# Patient Record
Sex: Female | Born: 1980 | Race: Black or African American | Hispanic: No | Marital: Married | Smoking: Never smoker
Health system: Southern US, Community
[De-identification: ages and names within clinical notes are randomized; demographics above are authoritative.]

## PROBLEM LIST (undated history)

## (undated) DIAGNOSIS — N979 Female infertility, unspecified: Secondary | ICD-10-CM

## (undated) DIAGNOSIS — K219 Gastro-esophageal reflux disease without esophagitis: Secondary | ICD-10-CM

## (undated) DIAGNOSIS — G4733 Obstructive sleep apnea (adult) (pediatric): Secondary | ICD-10-CM

## (undated) DIAGNOSIS — J45909 Unspecified asthma, uncomplicated: Secondary | ICD-10-CM

## (undated) DIAGNOSIS — Z789 Other specified health status: Secondary | ICD-10-CM

## (undated) DIAGNOSIS — R0602 Shortness of breath: Secondary | ICD-10-CM

## (undated) DIAGNOSIS — R12 Heartburn: Secondary | ICD-10-CM

## (undated) DIAGNOSIS — D649 Anemia, unspecified: Secondary | ICD-10-CM

## (undated) DIAGNOSIS — U099 Post covid-19 condition, unspecified: Secondary | ICD-10-CM

## (undated) HISTORY — DX: Gastro-esophageal reflux disease without esophagitis: K21.9

## (undated) HISTORY — DX: Obstructive sleep apnea (adult) (pediatric): G47.33

## (undated) HISTORY — DX: Post covid-19 condition, unspecified: U09.9

## (undated) HISTORY — DX: Shortness of breath: R06.02

## (undated) HISTORY — DX: Unspecified asthma, uncomplicated: J45.909

## (undated) HISTORY — PX: DIAGNOSTIC LAPAROSCOPY: SUR761

## (undated) HISTORY — DX: Heartburn: R12

## (undated) HISTORY — DX: Female infertility, unspecified: N97.9

## (undated) HISTORY — PX: DILATION AND CURETTAGE OF UTERUS: SHX78

## (undated) HISTORY — DX: Anemia, unspecified: D64.9

---

## 2010-12-23 ENCOUNTER — Other Ambulatory Visit (HOSPITAL_COMMUNITY): Payer: Self-pay | Admitting: Obstetrics and Gynecology

## 2010-12-23 DIAGNOSIS — N939 Abnormal uterine and vaginal bleeding, unspecified: Secondary | ICD-10-CM

## 2010-12-30 ENCOUNTER — Ambulatory Visit (HOSPITAL_COMMUNITY)
Admission: RE | Admit: 2010-12-30 | Discharge: 2010-12-30 | Disposition: A | Discharge status: Home / Self Care | Payer: 59 | Source: Ambulatory Visit | Attending: Obstetrics and Gynecology | Admitting: Obstetrics and Gynecology

## 2010-12-30 ENCOUNTER — Ambulatory Visit (HOSPITAL_COMMUNITY): Payer: 59

## 2010-12-30 DIAGNOSIS — N949 Unspecified condition associated with female genital organs and menstrual cycle: Secondary | ICD-10-CM | POA: Insufficient documentation

## 2010-12-30 DIAGNOSIS — N939 Abnormal uterine and vaginal bleeding, unspecified: Secondary | ICD-10-CM

## 2010-12-30 DIAGNOSIS — N96 Recurrent pregnancy loss: Secondary | ICD-10-CM | POA: Insufficient documentation

## 2012-10-12 NOTE — L&D Delivery Note (Signed)
Delivery Note At 2:12 PM a viable and healthy female was delivered via Vaginal, Spontaneous Delivery (Presentation: ; Occiput Anterior).  APGAR: 6, 7; weight pending. Baby to NICU. NICU in attendance. Placenta status: Intact, Spontaneous.  Cord: 3 vessels with the following complications: None.  Cord pH: 7.15  Anesthesia: Epidural  Episiotomy: None Lacerations: None Suture Repair: na Est. Blood Loss (mL): 300  Mom to postpartum.  Baby to NICU.  Latonya Knight J 06/23/2013, 5:29 PM

## 2013-03-03 LAB — OB RESULTS CONSOLE HEPATITIS B SURFACE ANTIGEN: Hepatitis B Surface Ag: NEGATIVE

## 2013-03-03 LAB — OB RESULTS CONSOLE RUBELLA ANTIBODY, IGM: Rubella: IMMUNE

## 2013-03-03 LAB — OB RESULTS CONSOLE ANTIBODY SCREEN: Antibody Screen: NEGATIVE

## 2013-03-03 LAB — OB RESULTS CONSOLE ABO/RH

## 2013-03-20 ENCOUNTER — Other Ambulatory Visit: Payer: Self-pay | Admitting: Obstetrics and Gynecology

## 2013-03-20 ENCOUNTER — Encounter (HOSPITAL_COMMUNITY): Payer: Self-pay | Admitting: *Deleted

## 2013-03-20 NOTE — H&P (Signed)
Tara Farrell, Tara Farrell              ACCOUNT NO.:  [id number]  MEDICAL RECORD NO.:  [id number]  LOCATION:  [facility]                         FACILITY:  [facility]  PHYSICIAN:  Lenoard Aden, M.D.DATE OF BIRTH:  12/23/1980  DATE OF ADMISSION:  03/21/2013 DATE OF DISCHARGE:                             HISTORY & PHYSICAL   CHIEF COMPLAINT:  History of second trimester pregnancy loss for cervical insufficiency, for McDonald cervical cerclage.  HISTORY OF PRESENT ILLNESS:  She is a 32 year old, African American female, G3, P1 with history of ectopic x1 and 18 week pregnancy loss who presents now for elective cerclage at 13 weeks and 1/7 day gestation.  ALLERGIES:  She has no known drug allergies.  MEDICATIONS:  Prenatal vitamins.  SOCIAL HISTORY:  She is a nonsmoker, nondrinker.  Denies domestic physical violence.  FAMILY HISTORY:  Heart disease and hypertension.  PREGNANCY HISTORY:  As previously noted.  PHYSICAL EXAMINATION:  GENERAL:  She is an obese African American female, height 62 inches, weight of 234 pounds. HEENT:  Normal. NECK:  Supple.  Full range of motion. LUNGS:  Clear. HEART:  Regular rate and rhythm. ABDOMEN:  Soft, gravid, and nontender.  Fetal heart tones auscultated. Cervix closed, long, out of the pelvis. EXTREMITIES:  No cords. NEUROLOGIC:  Nonfocal. SKIN:  Intact.  IMPRESSION:  A 13-week intrauterine pregnancy with a history of second trimester pregnancy loss with subjective diagnosis of cervical insufficiency.  PLAN:  Proceed with McDonald cervical cerclage.  Risks of anesthesia, infection, injury to surrounding organs, and need for repair was discussed, delayed versus immediate complications related to such injury as noted.  Small risk of less than 1% pregnancy loss discussed, inability to prevent second trimester or early pregnancy loss discussed. The patient acknowledges and wishes to proceed.     Lenoard Aden, M.D.     RJT/MEDQ  D:   03/20/2013  T:  03/20/2013  Job:  [id number]

## 2013-03-21 ENCOUNTER — Ambulatory Visit (HOSPITAL_COMMUNITY): Payer: Managed Care, Other (non HMO) | Admitting: Anesthesiology

## 2013-03-21 ENCOUNTER — Encounter (HOSPITAL_COMMUNITY): Payer: Self-pay | Admitting: Anesthesiology

## 2013-03-21 ENCOUNTER — Ambulatory Visit (HOSPITAL_COMMUNITY)
Admission: RE | Admit: 2013-03-21 | Discharge: 2013-03-21 | Disposition: A | Discharge status: Home / Self Care | Payer: Managed Care, Other (non HMO) | Source: Ambulatory Visit | Attending: Obstetrics and Gynecology | Admitting: Obstetrics and Gynecology

## 2013-03-21 ENCOUNTER — Encounter (HOSPITAL_COMMUNITY)
Admission: RE | Disposition: A | Discharge status: Home / Self Care | Payer: Self-pay | Source: Ambulatory Visit | Attending: Obstetrics and Gynecology

## 2013-03-21 ENCOUNTER — Encounter (HOSPITAL_COMMUNITY): Payer: Self-pay | Admitting: Pharmacy Technician

## 2013-03-21 ENCOUNTER — Encounter (HOSPITAL_COMMUNITY): Payer: Self-pay | Admitting: *Deleted

## 2013-03-21 DIAGNOSIS — O3431 Maternal care for cervical incompetence, first trimester: Secondary | ICD-10-CM

## 2013-03-21 DIAGNOSIS — O3430 Maternal care for cervical incompetence, unspecified trimester: Secondary | ICD-10-CM | POA: Insufficient documentation

## 2013-03-21 HISTORY — PX: CERVICAL CERCLAGE: SHX1329

## 2013-03-21 HISTORY — DX: Other specified health status: Z78.9

## 2013-03-21 LAB — BASIC METABOLIC PANEL
CO2: 20 mEq/L (ref 19–32)
Calcium: 9.4 mg/dL (ref 8.4–10.5)
GFR calc non Af Amer: >90 mL/min (ref >90)
Potassium: 3.5 mEq/L (ref 3.5–5.1)
Sodium: 133 mEq/L — ABNORMAL LOW (ref 135–145)

## 2013-03-21 LAB — CBC
Hemoglobin: 11.2 g/dL — ABNORMAL LOW (ref 12.0–15.0)
Platelets: 250 10*3/uL (ref 150–400)
RBC: 5.13 MIL/uL — ABNORMAL HIGH (ref 3.87–5.11)
WBC: 5.5 10*3/uL (ref 4.0–10.5)

## 2013-03-21 SURGERY — CERCLAGE, CERVIX, VAGINAL APPROACH
Anesthesia: Spinal | Site: Cervix | Wound class: Clean Contaminated

## 2013-03-21 MED ORDER — LACTATED RINGERS IV SOLN
INTRAVENOUS | Status: DC
  Administered 2013-03-21: 13:00:00 via INTRAVENOUS

## 2013-03-21 MED ORDER — PHENYLEPHRINE HCL 10 MG/ML IJ SOLN
INTRAMUSCULAR | Status: DC | PRN
  Administered 2013-03-21: 80 ug via INTRAVENOUS

## 2013-03-21 MED ORDER — BUPIVACAINE IN DEXTROSE 0.75-8.25 % IT SOLN
INTRATHECAL | Status: DC | PRN
  Administered 2013-03-21: 1.2 mL via INTRATHECAL

## 2013-03-21 MED ORDER — MIDAZOLAM HCL 2 MG/2ML IJ SOLN: 0.5000 mg | Freq: Once | INTRAMUSCULAR | Status: DC | PRN

## 2013-03-21 MED ORDER — CEFAZOLIN SODIUM-DEXTROSE 2-3 GM-% IV SOLR
INTRAVENOUS | Status: AC
  Filled 2013-03-21: qty 50

## 2013-03-21 MED ORDER — FENTANYL CITRATE 0.05 MG/ML IJ SOLN: 25.0000 ug | INTRAMUSCULAR | Status: DC | PRN

## 2013-03-21 MED ORDER — CEFAZOLIN SODIUM-DEXTROSE 2-3 GM-% IV SOLR
2.0000 g | INTRAVENOUS | Status: AC
  Administered 2013-03-21: 2 g via INTRAVENOUS

## 2013-03-21 MED ORDER — PROMETHAZINE HCL 25 MG/ML IJ SOLN: 6.2500 mg | INTRAMUSCULAR | Status: DC | PRN

## 2013-03-21 MED ORDER — MEPERIDINE HCL 25 MG/ML IJ SOLN: 6.2500 mg | INTRAMUSCULAR | Status: DC | PRN

## 2013-03-21 MED ORDER — PANTOPRAZOLE SODIUM 40 MG IV SOLR
40.0000 mg | Freq: Once | INTRAVENOUS | Status: AC
  Administered 2013-03-21: 40 mg via INTRAVENOUS
  Filled 2013-03-21: qty 40

## 2013-03-21 SURGICAL SUPPLY — 25 items
CATH ROBINSON RED A/P 16FR (CATHETERS) ×2 IMPLANT
CLOTH BEACON ORANGE TIMEOUT ST (SAFETY) ×2 IMPLANT
COUNTER NEEDLE 1200 MAGNETIC (NEEDLE) ×2 IMPLANT
GLOVE BIO SURGEON STRL SZ7.5 (GLOVE) ×4 IMPLANT
GLOVE BIOGEL PI IND STRL 7.0 (GLOVE) ×2 IMPLANT
GLOVE BIOGEL PI IND STRL 7.5 (GLOVE) ×2 IMPLANT
GLOVE BIOGEL PI INDICATOR 7.0 (GLOVE) ×2
GLOVE BIOGEL PI INDICATOR 7.5 (GLOVE) ×2
GLOVE ECLIPSE 6.5 STRL STRAW (GLOVE) ×4 IMPLANT
GLOVE SURG SS PI 6.5 STRL IVOR (GLOVE) ×2 IMPLANT
GLOVE SURG SS PI 7.0 STRL IVOR (GLOVE) ×2 IMPLANT
GLOVE SURG SS PI 7.5 STRL IVOR (GLOVE) ×2 IMPLANT
GOWN PREVENTION PLUS XLARGE (GOWN DISPOSABLE) ×2 IMPLANT
GOWN STRL REIN XL XLG (GOWN DISPOSABLE) ×8 IMPLANT
NEEDLE MAYO .5 CIRCLE (NEEDLE) ×2 IMPLANT
NS IRRIG 1000ML POUR BTL (IV SOLUTION) ×2 IMPLANT
PACK VAGINAL MINOR WOMEN LF (CUSTOM PROCEDURE TRAY) ×2 IMPLANT
PAD OB MATERNITY 4.3X12.25 (PERSONAL CARE ITEMS) ×2 IMPLANT
PAD PREP 24X48 CUFFED NSTRL (MISCELLANEOUS) ×6 IMPLANT
SUT ETHIBOND  5 (SUTURE) ×1
SUT ETHIBOND 5 (SUTURE) ×1 IMPLANT
SUT PROLENE 0 CT 1 30 (SUTURE) ×2 IMPLANT
TOWEL OR 17X24 6PK STRL BLUE (TOWEL DISPOSABLE) ×4 IMPLANT
WATER STERILE IRR 1000ML POUR (IV SOLUTION) ×2 IMPLANT
YANKAUER SUCT BULB TIP NO VENT (SUCTIONS) ×2 IMPLANT

## 2013-03-21 NOTE — Anesthesia Preprocedure Evaluation (Signed)
Anesthesia Evaluation  Patient identified by MRN, date of birth, ID band Patient awake    Reviewed: Allergy & Precautions, H&P , NPO status , Patient's Chart, lab work & pertinent test results  Airway Mallampati: III      Dental no notable dental hx.    Pulmonary neg pulmonary ROS,  breath sounds clear to auscultation  Pulmonary exam normal       Cardiovascular Exercise Tolerance: Good negative cardio ROS  Rhythm:regular Rate:Normal     Neuro/Psych negative neurological ROS  negative psych ROS   GI/Hepatic negative GI ROS, Neg liver ROS,   Endo/Other  negative endocrine ROSMorbid obesity  Renal/GU negative Renal ROS  negative genitourinary   Musculoskeletal   Abdominal Normal abdominal exam  (+)   Peds  Hematology negative hematology ROS (+)   Anesthesia Other Findings   Reproductive/Obstetrics (+) Pregnancy                           Anesthesia Physical Anesthesia Plan  ASA: III  Anesthesia Plan: Spinal   Post-op Pain Management:    Induction:   Airway Management Planned:   Additional Equipment:   Intra-op Plan:   Post-operative Plan:   Informed Consent: I have reviewed the patients History and Physical, chart, labs and discussed the procedure including the risks, benefits and alternatives for the proposed anesthesia with the patient or authorized representative who has indicated his/her understanding and acceptance.     Plan Discussed with: Anesthesiologist, CRNA and Surgeon  Anesthesia Plan Comments:         Anesthesia Quick Evaluation

## 2013-03-21 NOTE — Anesthesia Procedure Notes (Signed)
Spinal  Patient location during procedure: OR Start time: 03/21/2013 1:06 PM Staffing Anesthesiologist: Angus Seller., Harrell Gave. Performed by: anesthesiologist  Preanesthetic Checklist Completed: patient identified, site marked, surgical consent, pre-op evaluation, timeout performed, IV checked, risks and benefits discussed and monitors and equipment checked Spinal Block Patient position: sitting Prep: DuraPrep Patient monitoring: heart rate, cardiac monitor, continuous pulse ox and blood pressure Approach: midline Location: L3-4 Injection technique: single-shot Needle Needle type: Sprotte  Needle gauge: 24 G Needle length: 9 cm Assessment Sensory level: T4 Additional Notes Patient identified.  Risk benefits discussed including failed block, incomplete pain control, headache, nerve damage, paralysis, blood pressure changes, nausea, vomiting, reactions to medication both toxic or allergic, and postpartum back pain.  Patient expressed understanding and wished to proceed.  All questions were answered.  Sterile technique used throughout procedure.  CSF was clear.  No parasthesia or other complications.  Please see nursing notes for vital signs.

## 2013-03-21 NOTE — Op Note (Signed)
03/21/2013  1:26 PM  PATIENT:  Tara Farrell  32 y.o. female  PRE-OPERATIVE DIAGNOSIS:  Cervical Incompetence  - based on history 13 week IUIP  POST-OPERATIVE DIAGNOSIS:  Cervical Incompetence    PROCEDURE:  Procedure(s): CERCLAGE CERVICAL (McDonald) SURGEON:  Surgeon(s): Lenoard Aden, MD  ASSISTANTS: none   ANESTHESIA:   spinal  ESTIMATED BLOOD LOSS: minimal  DRAINS: none   LOCAL MEDICATIONS USED:  NONE  SPECIMEN:  No Specimen  DISPOSITION OF SPECIMEN:  N/A  COUNTS:  YES  DICTATION #: [id number]  PLAN OF CARE: DC home  PATIENT DISPOSITION:  PACU - hemodynamically stable.

## 2013-03-21 NOTE — Anesthesia Postprocedure Evaluation (Signed)
  Anesthesia Post Note  Patient: Tara Farrell  Procedure(s) Performed: Procedure(s) (LRB): CERCLAGE CERVICAL (N/A)  Anesthesia type: Spinal  Patient location: PACU  Post pain: Pain level controlled  Post assessment: Post-op Vital signs reviewed  Last Vitals:  Filed Vitals:   03/21/13 1330  BP: 89/49  Pulse: 90  Temp: 36.3 C  Resp: 17    Post vital signs: Reviewed  Level of consciousness: awake  Complications: No apparent anesthesia complications

## 2013-03-21 NOTE — OR Nursing (Signed)
Pt. Discharge time was 1715 not 1915.

## 2013-03-21 NOTE — Progress Notes (Signed)
Patient ID: Tara Farrell, female   DOB: 1981-03-16, 32 y.o.   MRN: [id number] Patient seen and examined. Fetal heart tones noted. Consent witnessed and signed. No changes noted. Update completed.

## 2013-03-21 NOTE — Transfer of Care (Signed)
Immediate Anesthesia Transfer of Care Note  Patient: Tara Farrell  Procedure(s) Performed: Procedure(s) with comments: CERCLAGE CERVICAL (N/A) - EDD: 09/25/13  Patient Location: PACU  Anesthesia Type:Spinal  Level of Consciousness: awake, oriented and patient cooperative  Airway & Oxygen Therapy: Patient Spontanous Breathing  Post-op Assessment: Report given to PACU RN and Post -op Vital signs reviewed and stable  Post vital signs: Reviewed and stable  Complications: No apparent anesthesia complications

## 2013-03-22 ENCOUNTER — Encounter (HOSPITAL_COMMUNITY): Payer: Self-pay | Admitting: Obstetrics and Gynecology

## 2013-03-22 NOTE — Op Note (Signed)
Tara Farrell, Tara Farrell              ACCOUNT NO.:  [id number]  MEDICAL RECORD NO.:  [id number]  LOCATION:  [facility]                          FACILITY:  [facility]  PHYSICIAN:  Lenoard Aden, M.D.DATE OF BIRTH:  [birth year]  DATE OF PROCEDURE:  03/21/2013 DATE OF DISCHARGE:  03/21/2013                              OPERATIVE REPORT   DESCRIPTION OF PROCEDURE:  After being apprised of risks of anesthesia, infection, bleeding, injury to surrounding organs, possible need for repair, possible risks of pregnancy loss.  Fetal heart tones are heard in recovery in the [facility], preop area at 168 beats per minute. The patient was then brought to the operating room, where she was administered spinal anesthetic without complications.  She was prepped and draped in the sterile fashion.  Catheterized until the bladder was empty.  Exam under anesthesia reveals a patulous, but otherwise normal length cervix and uterus 5-13 week size.  No adnexal masses were appreciated.  At this time, weighted speculum was placed and a 5 Ethibond sutures placed moving from 1 o'clock to 11 o'clock, 10 o'clock to 8 o'clock, 7 o'clock to 5 o'clock, 5 o'clock to 4 o'clock , 2 o'clock to back to 1 o'clock.  This was then tied down over a Prolene tie.  Both were tied and cut along.  The cervix reveals good coaptation.  There is no pulling through the stitch noted.  Good hemostasis was noted.  Fetal heart tone is pending and in recovery room.  The patient tolerated the procedure well and was transferred to recovery in good condition.     Lenoard Aden, M.D.     RJT/MEDQ  D:  03/21/2013  T:  03/22/2013  Job:  [id number]

## 2013-03-24 ENCOUNTER — Encounter (HOSPITAL_COMMUNITY): Payer: Self-pay | Admitting: *Deleted

## 2013-06-04 ENCOUNTER — Encounter (HOSPITAL_COMMUNITY): Payer: Self-pay | Admitting: *Deleted

## 2013-06-04 ENCOUNTER — Inpatient Hospital Stay (HOSPITAL_COMMUNITY)
Admission: AD | Admit: 2013-06-04 | Discharge: 2013-06-25 | DRG: 775 | Disposition: A | Discharge status: Home / Self Care | Payer: Managed Care, Other (non HMO) | Source: Ambulatory Visit | Attending: Obstetrics and Gynecology | Admitting: Obstetrics and Gynecology

## 2013-06-04 ENCOUNTER — Inpatient Hospital Stay (HOSPITAL_COMMUNITY): Payer: Managed Care, Other (non HMO)

## 2013-06-04 DIAGNOSIS — O26879 Cervical shortening, unspecified trimester: Secondary | ICD-10-CM | POA: Diagnosis present

## 2013-06-04 DIAGNOSIS — O4700 False labor before 37 completed weeks of gestation, unspecified trimester: Secondary | ICD-10-CM | POA: Diagnosis present

## 2013-06-04 DIAGNOSIS — O365990 Maternal care for other known or suspected poor fetal growth, unspecified trimester, not applicable or unspecified: Secondary | ICD-10-CM | POA: Diagnosis present

## 2013-06-04 DIAGNOSIS — O3430 Maternal care for cervical incompetence, unspecified trimester: Principal | ICD-10-CM | POA: Diagnosis present

## 2013-06-04 LAB — URINALYSIS, ROUTINE W REFLEX MICROSCOPIC
Bilirubin Urine: NEGATIVE
Glucose, UA: NEGATIVE mg/dL
Hgb urine dipstick: NEGATIVE
Ketones, ur: NEGATIVE mg/dL
Leukocytes, UA: NEGATIVE
Nitrite: NEGATIVE
Protein, ur: NEGATIVE mg/dL
Specific Gravity, Urine: 1.02 (ref 1.005–1.030)
Urobilinogen, UA: 0.2 mg/dL (ref 0.0–1.0)
pH: 8 (ref 5.0–8.0)

## 2013-06-04 LAB — CBC WITH DIFFERENTIAL/PLATELET
Basophils Absolute: 0 10*3/uL (ref 0.0–0.1)
Basophils Relative: 0 % (ref 0–1)
Eosinophils Absolute: 0 10*3/uL (ref 0.0–0.7)
Eosinophils Relative: 0 % (ref 0–5)
HCT: 33.9 % — ABNORMAL LOW (ref 36.0–46.0)
Hemoglobin: 11.1 g/dL — ABNORMAL LOW (ref 12.0–15.0)
Lymphocytes Relative: 16 % (ref 12–46)
Lymphs Abs: 1.6 10*3/uL (ref 0.7–4.0)
MCH: 22.3 pg — ABNORMAL LOW (ref 26.0–34.0)
MCHC: 32.7 g/dL (ref 30.0–36.0)
MCV: 68.2 fL — ABNORMAL LOW (ref 78.0–100.0)
Monocytes Absolute: 0.6 10*3/uL (ref 0.1–1.0)
Monocytes Relative: 6 % (ref 3–12)
Neutro Abs: 7.7 10*3/uL (ref 1.7–7.7)
Neutrophils Relative %: 78 % — ABNORMAL HIGH (ref 43–77)
Platelets: 260 10*3/uL (ref 150–400)
RBC: 4.97 MIL/uL (ref 3.87–5.11)
RDW: 15.5 % (ref 11.5–15.5)
WBC: 9.8 10*3/uL (ref 4.0–10.5)

## 2013-06-04 LAB — COMPREHENSIVE METABOLIC PANEL
ALT: 15 U/L (ref 0–35)
AST: 16 U/L (ref 0–37)
Albumin: 3.1 g/dL — ABNORMAL LOW (ref 3.5–5.2)
Alkaline Phosphatase: 95 U/L (ref 39–117)
BUN: 4 mg/dL — ABNORMAL LOW (ref 6–23)
CO2: 19 mEq/L (ref 19–32)
Calcium: 9.2 mg/dL (ref 8.4–10.5)
Chloride: 101 mEq/L (ref 96–112)
Creatinine, Ser: 0.34 mg/dL — ABNORMAL LOW (ref 0.50–1.10)
GFR calc Af Amer: >90 mL/min (ref >90)
GFR calc non Af Amer: >90 mL/min (ref >90)
Glucose, Bld: 108 mg/dL — ABNORMAL HIGH (ref 70–99)
Potassium: 3.1 mEq/L — ABNORMAL LOW (ref 3.5–5.1)
Sodium: 135 mEq/L (ref 135–145)
Total Bilirubin: 0.2 mg/dL — ABNORMAL LOW (ref 0.3–1.2)
Total Protein: 6.7 g/dL (ref 6.0–8.3)

## 2013-06-04 LAB — SAMPLE TO BLOOD BANK

## 2013-06-04 LAB — TYPE AND SCREEN
ABO/RH(D): A POS
Antibody Screen: NEGATIVE

## 2013-06-04 MED ORDER — NALBUPHINE SYRINGE 5 MG/0.5 ML
10.0000 mg | INJECTION | INTRAMUSCULAR | Status: DC | PRN
  Administered 2013-06-04: 10 mg via INTRAVENOUS
  Filled 2013-06-04: qty 1

## 2013-06-04 MED ORDER — ACETAMINOPHEN 325 MG PO TABS
650.0000 mg | ORAL_TABLET | ORAL | Status: DC | PRN
  Administered 2013-06-06 – 2013-06-19 (×3): 650 mg via ORAL
  Filled 2013-06-04 (×3): qty 2

## 2013-06-04 MED ORDER — MAGNESIUM SULFATE 40 G IN LACTATED RINGERS - SIMPLE
2.0000 g/h | INTRAVENOUS | Status: AC
  Administered 2013-06-05: 2 g/h via INTRAVENOUS
  Filled 2013-06-04 (×2): qty 500

## 2013-06-04 MED ORDER — LACTATED RINGERS IV BOLUS (SEPSIS)
1000.0000 mL | Freq: Once | INTRAVENOUS | Status: AC
  Administered 2013-06-04: 1000 mL via INTRAVENOUS

## 2013-06-04 MED ORDER — DOCUSATE SODIUM 100 MG PO CAPS
100.0000 mg | ORAL_CAPSULE | Freq: Two times a day (BID) | ORAL | Status: DC
  Administered 2013-06-05 – 2013-06-22 (×37): 100 mg via ORAL
  Filled 2013-06-04 (×36): qty 1

## 2013-06-04 MED ORDER — INDOMETHACIN 25 MG PO CAPS
25.0000 mg | ORAL_CAPSULE | Freq: Four times a day (QID) | ORAL | Status: DC
  Filled 2013-06-04: qty 1

## 2013-06-04 MED ORDER — DEXTROSE 5 % IV SOLN
500.0000 mg | INTRAVENOUS | Status: AC
  Administered 2013-06-04 – 2013-06-05 (×2): 500 mg via INTRAVENOUS
  Filled 2013-06-04 (×2): qty 500

## 2013-06-04 MED ORDER — NIFEDIPINE 10 MG PO CAPS
10.0000 mg | ORAL_CAPSULE | ORAL | Status: DC
  Administered 2013-06-04 – 2013-06-13 (×53): 10 mg via ORAL
  Filled 2013-06-04 (×53): qty 1

## 2013-06-04 MED ORDER — INDOMETHACIN 50 MG RE SUPP
50.0000 mg | Freq: Once | RECTAL | Status: AC
  Administered 2013-06-04: 50 mg via RECTAL
  Filled 2013-06-04: qty 1

## 2013-06-04 MED ORDER — BETAMETHASONE SOD PHOS & ACET 6 (3-3) MG/ML IJ SUSP
12.0000 mg | INTRAMUSCULAR | Status: AC
  Administered 2013-06-04 – 2013-06-05 (×2): 12 mg via INTRAMUSCULAR
  Filled 2013-06-04 (×2): qty 2

## 2013-06-04 MED ORDER — LACTATED RINGERS IV SOLN
INTRAVENOUS | Status: DC
  Administered 2013-06-04 – 2013-06-05 (×4): via INTRAVENOUS

## 2013-06-04 MED ORDER — PRENATAL MULTIVITAMIN CH
1.0000 | ORAL_TABLET | Freq: Every day | ORAL | Status: DC
  Administered 2013-06-05 – 2013-06-22 (×18): 1 via ORAL
  Filled 2013-06-04 (×17): qty 1

## 2013-06-04 MED ORDER — TERBUTALINE SULFATE 1 MG/ML IJ SOLN: 0.2500 mg | Freq: Once | INTRAMUSCULAR | Status: DC

## 2013-06-04 MED ORDER — NIFEDIPINE 10 MG PO CAPS
10.0000 mg | ORAL_CAPSULE | Freq: Once | ORAL | Status: AC
  Administered 2013-06-04: 10 mg via ORAL
  Filled 2013-06-04: qty 1

## 2013-06-04 MED ORDER — FAMOTIDINE 20 MG PO TABS
20.0000 mg | ORAL_TABLET | Freq: Two times a day (BID) | ORAL | Status: DC
  Administered 2013-06-04 – 2013-06-22 (×38): 20 mg via ORAL
  Filled 2013-06-04 (×38): qty 1

## 2013-06-04 MED ORDER — SODIUM CHLORIDE 0.9 % IV SOLN
2.0000 g | Freq: Four times a day (QID) | INTRAVENOUS | Status: DC
  Administered 2013-06-04 – 2013-06-06 (×8): 2 g via INTRAVENOUS
  Filled 2013-06-04 (×9): qty 2000

## 2013-06-04 MED ORDER — INDOMETHACIN 25 MG PO CAPS
25.0000 mg | ORAL_CAPSULE | ORAL | Status: DC
  Administered 2013-06-04 – 2013-06-06 (×13): 25 mg via ORAL
  Filled 2013-06-04 (×13): qty 1

## 2013-06-04 MED ORDER — SODIUM CHLORIDE 0.9 % IV SOLN
2.0000 g | Freq: Once | INTRAVENOUS | Status: AC
  Administered 2013-06-04: 2 g via INTRAVENOUS
  Filled 2013-06-04: qty 2000

## 2013-06-04 MED ORDER — TERBUTALINE SULFATE 1 MG/ML IJ SOLN
INTRAMUSCULAR | Status: AC
  Administered 2013-06-04: 1 mg via SUBCUTANEOUS
  Filled 2013-06-04: qty 1

## 2013-06-04 MED ORDER — MAGNESIUM SULFATE BOLUS VIA INFUSION
6.0000 g | Freq: Once | INTRAVENOUS | Status: AC
  Administered 2013-06-04: 6 g via INTRAVENOUS
  Filled 2013-06-04: qty 500

## 2013-06-04 NOTE — MAU Provider Note (Signed)
  History     CSN: [id number]  Arrival date and time: 06/04/13 8295 Call from nurse to provider @ [phone] Provider in room with patient at 0518  First Provider Initiated Contact with Patient 06/04/13 0524      Chief Complaint  Patient presents with  . Abdominal Cramping   HPI    Past Medical History  Diagnosis Date  . Medical history non-contributory     Past Surgical History  Procedure Laterality Date  . Dilation and curettage of uterus    . Diagnostic laparoscopy    . Cervical cerclage N/A 03/21/2013    Procedure: CERCLAGE CERVICAL;  Surgeon: Lenoard Aden, MD;  Location: [facility];  Service: Gynecology;  Laterality: N/A;  EDD: 09/25/13    History reviewed. No pertinent family history.  History  Substance Use Topics  . Smoking status: Never Smoker   . Smokeless tobacco: Never Used  . Alcohol Use: No    Allergies: No Known Allergies  Prescriptions prior to admission  Medication Sig Dispense Refill  . HYDROcodone-acetaminophen (VICODIN) 5-500 MG per tablet Take 1 tablet by mouth every 6 (six) hours as needed for pain.      . magnesium hydroxide (MILK OF MAGNESIA) 400 MG/5ML suspension Take 30 mLs by mouth daily as needed for constipation.      . Prenatal Vit-Fe Fumarate-FA (PRENATAL MULTIVITAMIN) TABS Take 1 tablet by mouth daily at 12 noon.        ROS Physical Exam   Blood pressure 133/88, pulse 105, temperature 97.8 F (36.6 C), resp. rate 20, height 5\' 6"  (1.676 m), weight 110.678 kg (244 lb), last menstrual period 12/19/2012, SpO2 100.00%.  Physical Exam  MAU Course  Procedures  MDM na  Assessment and Plan  PTL at 23 + weeks with cerclage Admit for acute tocolysis  Marlinda Mike 06/04/2013, 6:31 AM

## 2013-06-04 NOTE — Progress Notes (Signed)
Patient ID: Tara Farrell, female   DOB: 02-08-1981, 32 y.o.   MRN: [id number] HD#0 23 6/7 weeks PTL with cerclage  S: Feels much better. Denies contractions or bleeding. No LOF. Good FM.  No CP or SOB. No HA.  O: BP 118/57  Pulse 114  Temp(Src) 97.9 F (36.6 C) (Oral)  Resp 18  Ht 5\' 6"  (1.676 m)  Wt 110.678 kg (244 lb)  BMI 39.4 kg/m2  SpO2 100%  LMP 12/19/2012  Lungs: CTA CV: RRR Abd: soft gravid and non tender No CVAT VE: Loose 2cm, 80-90% effaced, Membranes to ext os, cerclage intact 360 degrees SCDs intact Neuro : nonfocal SKin: intact  FHR 150s, fair BTBV, no decels No contractions noted  Sono: EFW 528 gms Cephalic, nl afi  IMP: 23 6/7 weeks Acute PTL with cerclage. No evidence of chorio. Cerclage intact History of cervical insufficiency.  P: BMZ series MG neuroprotection Nifedipine tocolysis Indocin x 48hrs Abx prophylaxis NICU consult

## 2013-06-04 NOTE — Progress Notes (Signed)
Wiliam Ke CNM in. Spec exam done. Stitch still in place

## 2013-06-04 NOTE — Progress Notes (Signed)
Ultrasound finished 

## 2013-06-04 NOTE — H&P (Signed)
OB ADMISSION/ HISTORY & PHYSICAL:  Admission Date: 06/04/2013  4:33 AM  Admit Diagnosis: 23.[redacted] weeks gestation / hx previous second trimester loss / threatened preterm birth  Tara Farrell is a 32 y.o. female presenting for cramping intermittently since Friday - thought it was related to constipation but cramps became frequent and painful since midnight. Called [facility] at 0335 this am after seeing bright red blood when wiping after voiding. Sent to MAU for immediate evaluation.  Prenatal History: G3P0020   EDC : 09/25/2013, by Last Menstrual Period  Prenatal care at [facility]  Primary Ob Provider: Billy Coast Prenatal course complicated by hx ectopic / hx second trimester pregnancy loss - dilation & delivery at 18 weeks / cerclage prophylaxis first trimester / increased DS with genetic screening with normal [place] / constipation / ? SGA with follow-up CUS  Prenatal Labs: ABO, Rh:   A positive Antibody:  Negative Rubella:   Immune RPR:   NR HBsAg:   Negative HIV:   NR Hgb Electrophoresis: Negative Urine cx : Negative  Medical / Surgical History :  Past medical history:  Past Medical History  Diagnosis Date  . Medical history non-contributory     Past surgical history:  Past Surgical History  Procedure Laterality Date  . Dilation and curettage of uterus    . Diagnostic laparoscopy    . Cervical cerclage N/A 03/21/2013    Procedure: CERCLAGE CERVICAL;  Surgeon: Lenoard Aden, MD;  Location: [facility];  Service: Gynecology;  Laterality: N/A;  EDD: 09/25/13    Family History: History reviewed. No pertinent family history.   Social History:  reports that she has never smoked. She has never used smokeless tobacco. She reports that she does not drink alcohol or use illicit drugs.  Allergies: Review of patient's allergies indicates no known allergies.   Current Medications at time of admission:  Prior to Admission medications   Medication Sig Start Date End Date  Taking? Authorizing Provider  HYDROcodone-acetaminophen (VICODIN) 5-500 MG per tablet Take 1 tablet by mouth every 6 (six) hours as needed for pain.   Yes Historical Provider, MD  magnesium hydroxide (MILK OF MAGNESIA) 400 MG/5ML suspension Take 30 mLs by mouth daily as needed for constipation.   Yes Historical Provider, MD  Prenatal Vit-Fe Fumarate-FA (PRENATAL MULTIVITAMIN) TABS Take 1 tablet by mouth daily at 12 noon.    Historical Provider, MD    Review of Systems: Active FM Painful cramps since midnight - mild irregular cramping since Friday Denies any leakage of fluid bloody show + No intercourse since cerclage placement per patient Cervix closed on last exam at [facility] - cerclage intact  Physical Exam:  VS: Blood pressure 133/88, pulse 105, temperature 97.8 F (36.6 C), resp. rate 20, height 5\' 6"  (1.676 m), weight 110.678 kg (244 lb), last menstrual period 12/19/2012, SpO2 100.00%.  General: alert and oriented, appears uncomfortable - moaning and rolling in bed with contractions Heart: RRR Lungs: Clear lung fields Abdomen: Gravid, soft and non-tender, non-distended / uterus: gravid at 24 week size / non-tender Extremities: no edema Speculum exam: moderate amount red bloody mucus discharge in vaginal vault                              cervix appears dilated - no doming membranes visible - stitch visually appears intact Genitalia / VE: 2cm dilated / effacement thin with stitch palpable around entire cervical diameter  No BBOW / no presenting part identified                         + LUSD FHR: baseline rate 150 / variability moderate / accelerations absent / few mild variable decelerations TOCO: 2-3 minutes / palpate moderate to strong  Assessment: 23.[redacted] weeks gestation with cerclage Preterm labor with cervical change - risk for PTB FHR AGA   Plan:   Dr Billy Coast consulted & orders received for admission and plan of care  Terbutaline single dose to reduce  UC Labs and urine and GBS screening IVF hydration Magnesium 6gm load then 2 gm/hr ABX : Azithromycin and Ampicillin Complete bedrest / Trendelenburg / foley catheter placement Indocin protocol   Dr Joana Reamer - neonatologist on-call consulted for pre-term admission  informed of patient assessment and plan of care clearance for admit for tocolysis and potential PTB at 24 weeks BMZ series Watch cerclage closely, will not dc cerclage at this time.   Marlinda Mike CNM, MSN, [facility] 06/04/2013, 5:49 AM

## 2013-06-04 NOTE — Progress Notes (Signed)
Strip tracing maternal due to maternal position for sleep and obesity.  Pt sleeping soundly onto Left Lateral side.

## 2013-06-04 NOTE — Progress Notes (Signed)
Off monitor at this time. Bedside ultrasound in progress.

## 2013-06-04 NOTE — Progress Notes (Signed)
Patient ID: Netha Dafoe, female   DOB: 16-Sep-1981, 32 y.o.   MRN: [id number] Feels slight improvement in contraction frequency and intensity. Good FM No evidence of SROM. No bleeding. No CP or SOB. Minimal HA.  BP 115/60  Pulse 93  Temp(Src) 98.2 F (36.8 C) (Oral)  Resp 18  Ht 5\' 6"  (1.676 m)  Wt 110.678 kg (244 lb)  BMI 39.4 kg/m2  SpO2 100%  LMP 12/19/2012  Lungs : CTA CV: RRR Abd Gravid , NT No CVAT Ext: neg c/c/e Neuro: non focal Skin: intact  CBC    Component Value Date/Time   WBC 9.8 06/04/2013 0551   RBC 4.97 06/04/2013 0551   HGB 11.1* 06/04/2013 0551   HCT 33.9* 06/04/2013 0551   PLT 260 06/04/2013 0551   MCV 68.2* 06/04/2013 0551   MCH 22.3* 06/04/2013 0551   MCHC 32.7 06/04/2013 0551   RDW 15.5 06/04/2013 0551   LYMPHSABS 1.6 06/04/2013 0551   MONOABS 0.6 06/04/2013 0551   EOSABS 0.0 06/04/2013 0551   BASOSABS 0.0 06/04/2013 0551    IMP: 23 6/7 weeks PTL- no evidence of chorio. Cerclage intact  Plan: MG for Neuroprotection Prophylactic abx BMZ Acute tocolysis with Nifedipine and Indocin Sono pending. NICU aware

## 2013-06-04 NOTE — MAU Note (Signed)
Abdominal cramping that started Friday and progressively has gotten worst. Patient states she noticed a small amount of vaginal bleeding around 300 am. Cerclage in place.

## 2013-06-05 LAB — URINE CULTURE
Colony Count: NO GROWTH
Culture: NO GROWTH

## 2013-06-05 MED ORDER — PROGESTERONE 200 MG VA SUPP
200.0000 mg | Freq: Every day | VAGINAL | Status: DC
  Filled 2013-06-05: qty 1

## 2013-06-05 MED ORDER — PROGESTERONE MICRONIZED 200 MG PO CAPS
200.0000 mg | ORAL_CAPSULE | Freq: Every day | ORAL | Status: DC
  Administered 2013-06-05 – 2013-06-22 (×18): 200 mg via VAGINAL
  Filled 2013-06-05 (×19): qty 1

## 2013-06-05 NOTE — Progress Notes (Signed)
Ur chart review completed.  

## 2013-06-05 NOTE — Progress Notes (Signed)
RN called to room and pt c/o SOB, 02 sats WNL. Pt turning side to side trying to get comfortable.  Pt denies any pain.  HOB flattened a little help pt expand her lungs a  Little better.  Pt feeling much better after a few min.

## 2013-06-05 NOTE — Progress Notes (Signed)
Pt taken off monitor per orders

## 2013-06-05 NOTE — Progress Notes (Signed)
Trend flattened slightly for pt comfort with breathing

## 2013-06-05 NOTE — Progress Notes (Signed)
Monitors off while pt taking a bed bath

## 2013-06-05 NOTE — Progress Notes (Signed)
Patient ID: Tara Farrell, female   DOB: [birth year], 32 y.o.   MRN: [id number] HD#[id number] 24 0/7 weeks PTL with cerclage  S: Feels much better. Denies contractions or bleeding. Occ brownish dc No LOF. Good FM.  No CP or SOB. No HA.  O: BP 114/58  Pulse 102  Temp(Src) 98.2 F (36.8 C) (Oral)  Resp 20  Ht 5\' 6"  (1.676 m)  Wt 110.678 kg (244 lb)  BMI 39.4 kg/m2  SpO2 100%  LMP 12/19/2012  Lungs: CTA CV: RRR Abd: soft gravid and non tender No CVAT SCDs intact Neuro : nonfocal SKin: intact  FHR 150s, fair BTBV, no decels No contractions noted  Sono: EFW 528 gms Cephalic, nl afi  IMP: 24 0/7 weeks Acute PTL with cerclage. No evidence of chorio. Cerclage intact History of cervical insufficiency.  P: BMZ series MG neuroprotection- DC today Nifedipine tocolysis Indocin x 48hrs Abx prophylaxis NICU consult Start vaginal P4 200mg  qhs Change to 1hr fetal monitoring q shift.

## 2013-06-05 NOTE — Consult Note (Signed)
Neonatology Consult to Antenatal Patient:  I was asked by Dr.Taavon to see this patient in order to provide antenatal counseling due to incompetent cervix, preterm labor at 24 0/7 weeks.  Ms. Tara Farrell was admitted on 8/24 at 23 6/[redacted] weeks GA, 2 cm dilated with a cerclage in place and some bulging of the membranes. She has received Procardia, Indocin, BMZ times 2 doses, and is on magnesium sulfate and ampicillin currently. Her contractions have slowed down. The fetus is female and possibly SGA by ultrasound; this is the most advanced in pregnancy she has ever gotten.  I spoke with the patient and a female visitor. We discussed the worst case of delivery in the next 1-2 days, including usual DR management, possible respiratory complications and need for support, IV access, feedings (mother desires breast feeding, which was encouraged), LOS, Mortality and Morbidity, and long term outcomes. She had a few questions, which I answered. I offered a NICU tour to any interested family members and would be glad to come back if she has more questions later.  Thank you for asking me to see this patient.  Doretha Sou, MD Neonatologist  The total length of face-to-face or floor/unit time for this encounter was 20 minutes. Counseling and/or coordination of care was 15 minutes of the above.

## 2013-06-06 LAB — CULTURE, BETA STREP (GROUP B ONLY)

## 2013-06-06 MED ORDER — AMOXICILLIN 500 MG PO CAPS
500.0000 mg | ORAL_CAPSULE | Freq: Three times a day (TID) | ORAL | Status: AC
  Administered 2013-06-06 – 2013-06-11 (×15): 500 mg via ORAL
  Filled 2013-06-06 (×15): qty 1

## 2013-06-06 MED ORDER — INDOMETHACIN 25 MG PO CAPS
25.0000 mg | ORAL_CAPSULE | Freq: Four times a day (QID) | ORAL | Status: AC
  Administered 2013-06-06 – 2013-06-07 (×3): 25 mg via ORAL
  Filled 2013-06-06 (×3): qty 1

## 2013-06-06 NOTE — Progress Notes (Addendum)
  TC to Dr Billy Coast to report pt's complaint of SOB.  Pt presently in modified Trendelenburg HOB -2 degree with several pillows to support shoulders and.  Reported that breathsounds clear bilateral and pulse ox 95-96 to room air. Pt to be taken out of T-burg and to continue to observe pt.

## 2013-06-06 NOTE — Progress Notes (Signed)
Patient ID: Tara Farrell, female   DOB: Dec 04, 1980, 32 y.o.   MRN: [id number] HD#[id number] 24 1/7 weeks PTL with cerclage  S: Feels much better. Denies contractions or bleeding. Occ brownish dc No LOF. Good FM.  No CP or SOB. No HA. SOB resolved with change in position  O: BP 119/70  Pulse 80  Temp(Src) 98.4 F (36.9 C) (Oral)  Resp 20  Ht 5\' 6"  (1.676 m)  Wt 110.678 kg (244 lb)  BMI 39.4 kg/m2  SpO2 92%  LMP 12/19/2012  Lungs: CTA, no WRR CV: RRR Abd: soft gravid and non tender No CVAT SCDs intact Neuro : nonfocal SKin: intact  FHR 150s, fair BTBV, no decels No contractions noted  Sono: EFW 528 gms(8/24) Cephalic, nl afi  IMP: 24 1/7 weeks Acute PTL with cerclage. No evidence of chorio. Cerclage intact History of cervical insufficiency.  P: BMZ series complete MG neuroprotection- DC'ed Nifedipine tocolysis Indocin x 48hrs done today Abx prophylaxis- po today NICU consult done DC Foley Start vaginal P4 200mg  qhs Change to 1hr fetal monitoring q shift.

## 2013-06-07 ENCOUNTER — Inpatient Hospital Stay (HOSPITAL_COMMUNITY): Payer: Managed Care, Other (non HMO)

## 2013-06-07 MED ORDER — SODIUM CHLORIDE 0.9 % IJ SOLN
3.0000 mL | Freq: Two times a day (BID) | INTRAMUSCULAR | Status: DC
  Administered 2013-06-07 (×2): 3 mL via INTRAVENOUS

## 2013-06-07 MED ORDER — BUTALBITAL-APAP-CAFFEINE 50-325-40 MG PO TABS
2.0000 | ORAL_TABLET | Freq: Once | ORAL | Status: AC
  Administered 2013-06-07: 2 via ORAL
  Filled 2013-06-07: qty 2

## 2013-06-07 NOTE — Progress Notes (Signed)
Patient ID: Tara Farrell, female   DOB: June 14, 1981, 32 y.o.   MRN: [id number] HD#[id number] 24 2/7 weeks PTL with cerclage  S: Feels much better. No perineal pain Denies contractions or bleeding. Occ brownish dc No LOF. Good FM.  No CP or SOB. No HA. SOB resolved with change in position  O: BP 114/47  Pulse 98  Temp(Src) 97.6 F (36.4 C) (Oral)  Resp 16  Ht 5\' 6"  (1.676 m)  Wt 110.678 kg (244 lb)  BMI 39.4 kg/m2  SpO2 95%  LMP 12/19/2012  Lungs: CTA, no WRR CV: RRR Abd: soft gravid and non tender No CVAT SCDs intact Neuro : nonfocal SKin: intact  FHR 150s, fair BTBV, no decels No contractions noted Reassuring NST x 3 noted  Sono: EFW 528 gms(8/24) Cephalic, nl afi  IMP: 24 2/7 weeks Acute PTL with cerclage. No evidence of chorio. Cerclage intact History of cervical insufficiency.  P: BMZ series complete MG neuroprotection- DC'ed Nifedipine tocolysis Indocin x 72hrs done today Abx prophylaxis- po today NICU consult done Vaginal P4 200mg  qhs  1hr fetal monitoring q shift. BPP tomorrow

## 2013-06-08 ENCOUNTER — Inpatient Hospital Stay (HOSPITAL_COMMUNITY): Payer: Managed Care, Other (non HMO)

## 2013-06-08 NOTE — Progress Notes (Signed)
Patient ID: Tara Farrell, female   DOB: 23-Feb-1981, 32 y.o.   MRN: [id number] HD#[id number] 24 3/7 weeks PTL with cerclage  S: Feels good. No perineal pain Denies contractions or bleeding. Occ brownish dc No LOF. Good FM.  No CP or SOB. No HA after Fioricet. SOB resolved with change in position and has not recurred.  O: BP 144/76  Pulse 112  Temp(Src) 98.2 F (36.8 C) (Oral)  Resp 20  Ht 5\' 6"  (1.676 m)  Wt 110.315 kg (243 lb 3.2 oz)  BMI 39.27 kg/m2  SpO2 95%  LMP 12/19/2012  Lungs: CTA, no WRR CV: RRR Abd: soft gravid and non tender No CVAT SCDs intact Neuro : nonfocal SKin: intact  FHR 150s, fair BTBV, no recurrent decels, occ mild variable No contractions noted Reassuring NST x 3 noted  Sono: EFW 528 gms(8/24) Cephalic, nl afi BPP 8/8 today  IMP: 24 3/7 weeks Acute PTL with cerclage. No evidence of chorio. Cerclage intact History of cervical insufficiency.  P: BMZ series complete MG neuroprotection- DC'ed Nifedipine tocolysis Indocin x 72hrs done  Abx prophylaxis- po amoxicillin NICU consult done Vaginal P4 200mg  qhs 1hr fetal monitoring q shift. BPP tomorrow

## 2013-06-08 NOTE — Progress Notes (Signed)
Tara Farrell  was seen today for an ultrasound appointment.  See full report in [facility].  Impression: Single IUP at 24 3/7 weeks Preterm labor, shortened cervix s/p cerclage Active fetus - BPP of 8/8 Normal amniotic flud volume  Recommendations: Follow-up ultrasounds as clinically indicated.   Alpha Gula, MD

## 2013-06-08 NOTE — Progress Notes (Signed)
Antenatal Nutrition Assessment:  Currently  24 3/[redacted] weeks gestation, with s/p cerclage. Height  66" Weight 243 lbs pre-pregnancy weight 234 lbs.Pre-pregnancy  BMI 37.7  IBW 130 lbs  Total weight gain 9 lbs. Weight gain goals 11-20 lbs.   Estimated needs: 21-2300 kcal/day, 75-85 grams protein/day, 2.5 liters fluid/day  Antenatal regular diet tolerated well, appetite good. Current diet prescription will provide for increased needs.  No abnormal nutrition related labs  Nutrition Dx: Increased nutrient needs r/t pregnancy and fetal growth requirements aeb [redacted] weeks gestation.  No educational needs assessed at this time.  Elisabeth Cara M.Odis Luster LDN Neonatal Nutrition Support Specialist Pager [phone]

## 2013-06-09 NOTE — Progress Notes (Signed)
Patient ID: Tara Farrell, female   DOB: 21-Nov-1980, 32 y.o.   MRN: [id number] HD#[id number] 24 4/7 weeks PTL with cerclage  S: Feels good. No perineal pain Denies contractions or bleeding. Occ brownish dc No LOF. Good FM.  No CP or SOB. No HA after Fioricet. SOB resolved with change in position and has not recurred.  O: BP 124/94  Pulse 114  Temp(Src) 98.1 F (36.7 C) (Oral)  Resp 18  Ht 5\' 6"  (1.676 m)  Wt 110.315 kg (243 lb 3.2 oz)  BMI 39.27 kg/m2  SpO2 95%  LMP 12/19/2012  Lungs: CTA, no WRR CV: RRR Abd: soft gravid and non tender No CVAT SCDs intact Neuro : nonfocal SKin: intact VE:2-3/80/-1, cerclage intact  FHR 150s, fair BTBV, no recurrent decels, occ mild variable No contractions noted Reassuring NST x 3 noted  Sono: EFW 528 gms(8/24) Cephalic, nl afi BPP 8/8 yesterday  IMP: 24 4/7 weeks Acute PTL with cerclage. No evidence of chorio. Cerclage intact History of cervical insufficiency.  P: BMZ series complete MG neuroprotection- DC'ed Nifedipine tocolysis Indocin x 72hrs done  Abx prophylaxis  NICU consult done Vaginal P4 200mg  qhs 1hr fetal monitoring q shift. BPP noted and will do twice weekly

## 2013-06-10 NOTE — Progress Notes (Signed)
Genia Del  Physician Signed Obstetrics Progress Notes    Patient ID: Tara Farrell, female DOB: 1981-07-04, 32 y.o. MRN: [id number]  HD#[id number]  24 5/7 weeks  PTL with cerclage  S:  Feels good. No perineal pain  Denies contractions or bleeding.  Occ brownish dc  No LOF. Good FM.  No CP or SOB. No HA after Fioricet.  SOB resolved with change in position and has not recurred.   O: VSS O: BP 115/97 (only one out of normal range), repeated at 121/70  Pulse 108  Temp(Src) 98.6 F (37 C) (Oral)  Resp 20  Ht 5\' 6"  (1.676 m)  Wt 110.315 kg (243 lb 3.2 oz)  BMI 39.27 kg/m2  SpO2 95%  LMP 12/19/2012 Lungs: CTA, no WRR  CV: RRR  Abd: soft gravid and non tender  No CVAT  SCDs intact  Neuro : nonfocal  SKin: intact  VE:2-3/80/-1, cerclage intact by Dr Billy Coast 8/29th FHR 150s, fair BTBV, no recurrent decels, occ mild variable  No contractions noted  Reassuring NST x 3 noted  Sono: EFW 528 gms(8/24)  Cephalic, nl afi  BPP 8/8  8/28  IMP:  24 5/7 weeks  Acute PTL with cerclage. No evidence of chorio. Cerclage intact  History of cervical insufficiency.  P:  BMZ series complete  MG neuroprotection- DC'ed  Nifedipine tocolysis  Indocin x 72hrs done  Abx prophylaxis  NICU consult done  Vaginal P4 200mg  qhs  1hr fetal monitoring q shift.  BPP noted and will do twice weekly   Genia Del MD  06/10/2013 at 11 am

## 2013-06-10 NOTE — Progress Notes (Signed)
Pt off the monitor after reassurring FHR  

## 2013-06-11 ENCOUNTER — Inpatient Hospital Stay (HOSPITAL_COMMUNITY): Payer: Managed Care, Other (non HMO)

## 2013-06-11 MED ORDER — SODIUM CHLORIDE 0.9 % IJ SOLN
3.0000 mL | Freq: Two times a day (BID) | INTRAMUSCULAR | Status: DC
  Administered 2013-06-11 – 2013-06-22 (×20): 3 mL via INTRAVENOUS

## 2013-06-11 MED ORDER — SODIUM CHLORIDE 0.9 % IJ SOLN
3.0000 mL | INTRAMUSCULAR | Status: DC | PRN
  Administered 2013-06-12: 3 mL via INTRAVENOUS

## 2013-06-11 MED ORDER — LACTATED RINGERS IV SOLN
INTRAVENOUS | Status: DC
  Administered 2013-06-11 – 2013-06-19 (×3): via INTRAVENOUS
  Administered 2013-06-20: 100 mL via INTRAVENOUS

## 2013-06-11 NOTE — Progress Notes (Signed)
Hospital day # 7 pregnancy at [redacted]w[redacted]d  Cervical incompetence/Cerclage  S: well, reports good fetal activity      Contractions:none      Vaginal bleeding:none now       Vaginal discharge: no significant change  O: BP 126/80  Pulse 114  Temp(Src) 97.8 F (36.6 C) (Oral)  Resp 18  Ht 5\' 6"  (1.676 m)  Wt 110.315 kg (243 lb 3.2 oz)  BMI 39.27 kg/m2  SpO2 95%  LMP 12/19/2012      Fetal tracings:Fetal heart variability: moderate, no deceleration 06/10/2013,   reviewed and reassuring.  This am monitoring pending.      Uterus gravid and non-tender      VE 2/70%/high.  Cerclage intact.      Extremities: no significant edema and no signs of DVT  A: [redacted]w[redacted]d with Cervical Incompetence, Cerclage stable     Unchanged cervix.  No evidence of PTL.  P: continue current plan of care  Tara Farrell,MARIE-LYNE  MD 06/11/2013 9:51 AM

## 2013-06-11 NOTE — Progress Notes (Signed)
MD viewing the FHR strip calling to discuss with RN.  MD updated on status and and U/S tech at bedside at this time.

## 2013-06-11 NOTE — Progress Notes (Signed)
BS u/s for BPP

## 2013-06-12 ENCOUNTER — Inpatient Hospital Stay (HOSPITAL_COMMUNITY): Payer: Managed Care, Other (non HMO)

## 2013-06-12 NOTE — Progress Notes (Signed)
Pt sitting up in the bed eating lunch, no complaints

## 2013-06-12 NOTE — Progress Notes (Signed)
Patient ID: Tara Farrell, female   DOB: 05/05/1981, 32 y.o.   MRN: [id number] HD#[id number] 25 0/7 weeks PTL with cerclage  S: Feels good. No perineal pain Denies contractions or bleeding. Occ brownish dc No LOF. Good FM.  No CP or SOB. No HA after Fioricet. SOB resolved with change in position and has not recurred.  O: BP 137/72  Pulse 122  Temp(Src) 98.9 F (37.2 C) (Oral)  Resp 18  Ht 5\' 6"  (1.676 m)  Wt 110.315 kg (243 lb 3.2 oz)  BMI 39.27 kg/m2  SpO2 100%  LMP 12/19/2012  Lungs: CTA, no WRR CV: RRR Abd: soft gravid and non tender No CVAT SCDs intact Neuro : nonfocal SKin: intact VE:2-3/80/-1, cerclage intact  FHR 150s, fair BTBV, no recurrent decels, occ mild variable No contractions noted Reassuring NST x 3 noted  Sono: EFW 528 gms(8/24) Cephalic, nl afi BPP 8/8 today  IMP: 25 0/7 weeks Acute PTL with cerclage. No evidence of chorio. Cerclage intact History of cervical insufficiency. History of intermittent prolonged decelerations- now improved  P: BMZ series complete MG neuroprotection- DC'ed Nifedipine tocolysis- will dc tomorrow Indocin x 72hrs done  Abx prophylaxis done NICU consult done Vaginal P4 200mg  qhs 1hr fetal monitoring q shift per MFM recommendation BPP noted and will do daily per recommendation

## 2013-06-12 NOTE — Progress Notes (Addendum)
Pt off the monitor after receiving order change for fetal monitoring.

## 2013-06-12 NOTE — Progress Notes (Signed)
06/12/13  At 6:49 am  S/O  Monitoring yesterday with 2 prolonged decelerations.  BPP done at 6/8 (no sustained BMs). Put on continuous monitoring.  Base line 145-150/min, variability present, occasional mild variable decelerations. Difficult to keep FHR continuously as fetus is moving.  Discontinued from around 1:30 am to 6 am to let patient sleep. This am, FHR base line still 145-150/min.  No deceleration seen, but FHR comes off frequently.  A/P  Fetal Well-being probably good, but monitoring suboptimal to assess.         Decision to repeat BPP with dopplers and EFW with MFM today.  Genia Del MD

## 2013-06-12 NOTE — Progress Notes (Signed)
U/S requesting clarification if u/s to be done at [facility].  Per MD pt may have W/C ride to and from u/s dept. If pt lying down immediatley.  U/S tech informed

## 2013-06-12 NOTE — Progress Notes (Signed)
To us via wc.

## 2013-06-13 ENCOUNTER — Inpatient Hospital Stay (HOSPITAL_COMMUNITY): Payer: Managed Care, Other (non HMO)

## 2013-06-13 LAB — TYPE AND SCREEN
ABO/RH(D): A POS
ABO/RH(D): A POS
Antibody Screen: NEGATIVE
Antibody Screen: NEGATIVE

## 2013-06-13 MED ORDER — NIFEDIPINE 10 MG PO CAPS
10.0000 mg | ORAL_CAPSULE | Freq: Three times a day (TID) | ORAL | Status: DC
  Administered 2013-06-13 – 2013-06-15 (×6): 10 mg via ORAL
  Filled 2013-06-13 (×6): qty 1

## 2013-06-13 NOTE — Progress Notes (Signed)
Patient ID: Tara Farrell, female   DOB: [birth year], 32 y.o.   MRN: [id number] HD#[id number] 25 1/7 weeks PTL with cerclage  S: Feels good. No perineal pain Denies contractions or bleeding. Occ brownish dc No LOF. Good FM.  No CP or SOB. No HA. SOB resolved with change in position and has not recurred.  O: BP 128/80  Pulse 113  Temp(Src) 98.9 F (37.2 C) (Oral)  Resp 16  Ht 5\' 6"  (1.676 m)  Wt 110.315 kg (243 lb 3.2 oz)  BMI 39.27 kg/m2  SpO2 100%  LMP 12/19/2012  Lungs: CTA, no WRR CV: RRR Abd: soft gravid and non tender No CVAT SCDs intact Neuro : nonfocal SKin: intact VE:2-3/80/-1, cerclage intact  FHR 150s, fair BTBV, no recurrent decels, occ mild variable No contractions noted Reassuring NST x 3 noted  Sono: EFW 528 gms(8/24) Cephalic, nl afi BPP 8/8 pending  IMP: 25 1/7 weeks Acute PTL with cerclage. No evidence of chorio. Cerclage intact History of cervical insufficiency. History of intermittent prolonged decelerations- now improved  P: BMZ series complete MG neuroprotection- DC'ed Nifedipine tocolysis- will decrease dose Indocin x 72hrs done  Abx prophylaxis done NICU consult done Vaginal P4 200mg  qhs 1hr fetal monitoring q shift per MFM recommendation BPP noted and will do daily per recommendation

## 2013-06-13 NOTE — Progress Notes (Signed)
06/13/13 1400  Clinical Encounter Type  Visited With Patient  Visit Type Initial;Spiritual support;Social support  Spiritual Encounters  Spiritual Needs Emotional   Made initial visit to introduce spiritual care and chaplain availability.  Tara Farrell was in very positive spirits, feeling encouraged and supported by boyfriend [name], family, friends, and church family.  Because she works two [employer])  jobs, an ante stay is a huge change in pace and lifestyle for her.  She is using gratitude well to cope.  Per pt, now that she has adjusted to needing to be here for the duration, she is feeling more comfortable and confident.  Will follow for support, but please also page as needed.  [address] [place], [place] [phone]

## 2013-06-13 NOTE — Progress Notes (Signed)
Tara Farrell  was seen today for an ultrasound appointment.  See full report in [facility].  Impression: Single IUP at 25 1/7 weeks Cervical insufficiency, s/p cerclage Normal fetal growth on 8/24 (27th %tile) BPP 6/8 (-2 for lack of sustained fetal breathing activity) Normal amniotic fluid volume  Recommendations: Would coorelate BPP with NST today.   If tracing is non reactive, recommend follow up BPP tomorrow.  If reactive, may follow up BPPs as clinically indicated.  Alpha Gula, MD

## 2013-06-14 ENCOUNTER — Inpatient Hospital Stay (HOSPITAL_COMMUNITY): Payer: Managed Care, Other (non HMO)

## 2013-06-14 NOTE — Progress Notes (Signed)
Tara Farrell  was seen today for an ultrasound appointment.  See full report in [facility].  Impression: Single IUP at 25 2/7 weeks Cervical insufficiency, s/p cerclage Normal fetal growth on 8/24 (27th %tile) BPP 6/8 (-2 for absent fetal breathing) Normal amniotic fluid volume  Recommendations: Would coorelate BPP with NST today.   If tracing is non reactive, recommend follow up BPP tomorrow.  If reactive, may follow up BPPs as clinically indicated.  Alpha Gula, MD

## 2013-06-14 NOTE — Progress Notes (Signed)
Patient ID: Tara Farrell, female   DOB: 04-19-1981, 32 y.o.   MRN: [id number] HD#[id number] 25 2/7 weeks PTL with cerclage  S: Feels good. No perineal pain Denies contractions or bleeding. Occ brownish dc but no bleeding No LOF. Good FM.  No CP or SOB. No HA.   O: BP 131/74  Pulse 124  Temp(Src) 98.3 F (36.8 C) (Oral)  Resp 18  Ht 5\' 6"  (1.676 m)  Wt 110.315 kg (243 lb 3.2 oz)  BMI 39.27 kg/m2  SpO2 100%  LMP 12/19/2012  Lungs: CTA, no WRR CV: RRR Abd: soft gravid and non tender No CVAT SCDs intact Neuro : nonfocal SKin: intact [id number]  FHR 150s, fair BTBV, no recurrent decels, occ mild variable, one tracing with prolonged decel No contractions noted Reassuring NST x 3 noted but not reactivve  Sono: EFW 528 gms(8/24) Cephalic, nl afi BPP 6/8 today  IMP: 25 2/7 weeks Acute PTL with cerclage. No evidence of chorio. Cerclage intact History of cervical insufficiency. History of intermittent prolonged decelerations- now improved  P: BMZ series complete MG neuroprotection- DC'ed Nifedipine tocolysis- will decrease dose Indocin x 72hrs done  Abx prophylaxis done NICU consult done Vaginal P4 200mg  qhs 1hr fetal monitoring q shift per MFM recommendation BPP noted and will do daily per recommendation

## 2013-06-15 ENCOUNTER — Inpatient Hospital Stay (HOSPITAL_COMMUNITY): Payer: Managed Care, Other (non HMO)

## 2013-06-15 NOTE — Progress Notes (Signed)
Tara Farrell  was seen today for an ultrasound appointment.  See full report in [facility].  Impression: Single IUP at 25 2/7 weeks Cervical insufficiency, s/p cerclage Normal fetal growth on 8/24 (27th %tile) BPP 8/8 Normal amniotic fluid volume  Recommendations: Follow up BPPs as clinically indicated.  Alpha Gula, MD

## 2013-06-15 NOTE — Progress Notes (Signed)
To MFM for ultrasound.

## 2013-06-15 NOTE — Progress Notes (Signed)
Patient ID: Tara Farrell, female   DOB: 05-26-1981, 32 y.o.   MRN: [id number] HD#[id number] 25 3/7 weeks PTL with cerclage  S: Feels good. No perineal pain Denies contractions or bleeding. Occ brownish dc but no bleeding No LOF. Good FM.  No CP or SOB. No HA.   O: BP 135/74  Pulse 107  Temp(Src) 98.3 F (36.8 C) (Oral)  Resp 18  Ht 5\' 6"  (1.676 m)  Wt 110.315 kg (243 lb 3.2 oz)  BMI 39.27 kg/m2  SpO2 100%  LMP 12/19/2012  Lungs: CTA, no WRR CV: RRR Abd: soft gravid and non tender No CVAT SCDs intact Neuro : nonfocal SKin: intact [id number]  FHR 140-150s, fair BTBV, no recurrent decels, occ mild variable No contractions noted Reassuring NST x 3 noted but not reactivve  Sono: EFW 528 gms(8/24) Cephalic, nl afi BPP 6/8 yesterday and pending today  IMP: 25 3/7 weeks Acute PTL with cerclage. No evidence of chorio. Cerclage intact History of cervical insufficiency. History of intermittent prolonged decelerations- now improved Mildly elevated BP- no s/s PEC- nl labs 8/24  P: Rpt labs tomorrow BMZ series complete 8/25 MG neuroprotection done one 8/24 Nifedipine tocolysis- will dc Abx prophylaxis done NICU consult done Vaginal P4 200mg  qhs 1hr fetal monitoring q shift per MFM recommendation BPP noted and will do daily per recommendation

## 2013-06-16 ENCOUNTER — Ambulatory Visit (HOSPITAL_COMMUNITY): Payer: Managed Care, Other (non HMO)

## 2013-06-16 LAB — COMPREHENSIVE METABOLIC PANEL
AST: 12 U/L (ref 0–37)
Albumin: 2.9 g/dL — ABNORMAL LOW (ref 3.5–5.2)
Alkaline Phosphatase: 86 U/L (ref 39–117)
Chloride: 99 mEq/L (ref 96–112)
Potassium: 3.8 mEq/L (ref 3.5–5.1)
Total Bilirubin: 0.2 mg/dL — ABNORMAL LOW (ref 0.3–1.2)

## 2013-06-16 LAB — CBC
Platelets: 295 10*3/uL (ref 150–400)
RDW: 15.9 % — ABNORMAL HIGH (ref 11.5–15.5)
WBC: 9.3 10*3/uL (ref 4.0–10.5)

## 2013-06-16 LAB — TYPE AND SCREEN: Antibody Screen: NEGATIVE

## 2013-06-16 NOTE — Progress Notes (Signed)
MD made aware of decels and cont with BPP daily while variables noted on the fetal monitoring strips.

## 2013-06-16 NOTE — Progress Notes (Signed)
Pt returning from MFM with no complaints

## 2013-06-16 NOTE — Progress Notes (Signed)
Patient ID: Tara Farrell, female   DOB: [birth year], 32 y.o.   MRN: [id number] HD#[id number] 25 4/7 weeks PTL with cerclage  S: Feels good. No perineal pain Denies contractions or bleeding. Occ brownish dc but no bleeding No LOF. Good FM.  No CP or SOB. No HA. No epigastric pain, no visual changes.   O: BP 132/83  Pulse 119  Temp(Src) 98.3 F (36.8 C) (Oral)  Resp 20  Ht 5\' 6"  (1.676 m)  Wt 110.315 kg (243 lb 3.2 oz)  BMI 39.27 kg/m2  SpO2 100%  LMP 12/19/2012  CBC    Component Value Date/Time   WBC 9.3 06/16/2013 0545   RBC 5.06 06/16/2013 0545   HGB 11.2* 06/16/2013 0545   HCT 35.1* 06/16/2013 0545   PLT 295 06/16/2013 0545   MCV 69.4* 06/16/2013 0545   MCH 22.1* 06/16/2013 0545   MCHC 31.9 06/16/2013 0545   RDW 15.9* 06/16/2013 0545   LYMPHSABS 1.6 06/04/2013 0551   MONOABS 0.6 06/04/2013 0551   EOSABS 0.0 06/04/2013 0551   BASOSABS 0.0 06/04/2013 0551     Lungs: CTA, no WRR CV: RRR Abd: soft gravid and non tender No CVAT SCDs intact Neuro : nonfocal SKin: intact [id number]  FHR 140-150s, fair BTBV, no recurrent decels, occ mild variable One tracing with 2 prolonged decels noted.l No contractions noted Reassuring NST x 3 noted but not reactivve  Sono: EFW 528 gms(8/24) Cephalic, nl afi BPP 8/8 yesterday and pending today  IMP: 25 4/7 weeks Acute PTL with cerclage. No evidence of chorio. Cerclage intact History of cervical insufficiency. History of intermittent prolonged decelerations- now improved Mildly elevated BP- no s/s PEC- labs nl today  P: Rpt labs today BMZ series complete 8/25 MG neuroprotection done 8/24 Nifedipine tocolysis- will dc Abx prophylaxis done NICU consult done Vaginal P4 200mg  qhs 1hr fetal monitoring q shift per MFM recommendation BPP noted and will do daily per recommendation

## 2013-06-17 ENCOUNTER — Inpatient Hospital Stay (HOSPITAL_COMMUNITY): Payer: Managed Care, Other (non HMO)

## 2013-06-17 NOTE — Progress Notes (Signed)
Patient ID: Tara Farrell, female   DOB: 1981/07/07, 32 y.o.   MRN: [id number] HD#[id number] 25 5/7 weeks PTL with cerclage  S: Feels good. No perineal pain Denies contractions or bleeding. Occ brownish dc but no bleeding No LOF. Good FM.  No CP or SOB. No HA. No epigastric pain, no visual changes.   O: BP 126/68  Pulse 104  Temp(Src) 98.3 F (36.8 C) (Oral)  Resp 16  Ht 5\' 6"  (1.676 m)  Wt 110.315 kg (243 lb 3.2 oz)  BMI 39.27 kg/m2  SpO2 100%  LMP 12/19/2012  CBC    Component Value Date/Time   WBC 9.3 06/16/2013 0545   RBC 5.06 06/16/2013 0545   HGB 11.2* 06/16/2013 0545   HCT 35.1* 06/16/2013 0545   PLT 295 06/16/2013 0545   MCV 69.4* 06/16/2013 0545   MCH 22.1* 06/16/2013 0545   MCHC 31.9 06/16/2013 0545   RDW 15.9* 06/16/2013 0545   LYMPHSABS 1.6 06/04/2013 0551   MONOABS 0.6 06/04/2013 0551   EOSABS 0.0 06/04/2013 0551   BASOSABS 0.0 06/04/2013 0551     Lungs: CTA, no WRR CV: RRR Abd: soft gravid and non tender No CVAT SCDs intact Neuro : nonfocal SKin: intact [id number]  FHR 140-150s, fair BTBV, no recurrent decels, occ mild variable One tracing with occ prolonged decels noted.l No contractions noted Reassuring NST x 3 noted but not reactivve  Sono: EFW 528 gms(8/24) Cephalic, nl afi BPP 8/8  today  IMP: 25 5/7 weeks Acute PTL with cerclage and advanced dilatation. No evidence of chorio. Cerclage intact History of cervical insufficiency. History of intermittent prolonged decelerations on monitoring, reassuring daily BPPs Mildly elevated BP- no s/s PEC- labs nl 9/5- stable BP  P: BMZ series complete 8/25 MG neuroprotection done 8/24 Abx prophylaxis done NICU consult done Vaginal P4 200mg  qhs 1hr fetal monitoring q shift per MFM recommendation BPP noted and will do daily per recommendation

## 2013-06-18 ENCOUNTER — Inpatient Hospital Stay (HOSPITAL_COMMUNITY): Payer: Managed Care, Other (non HMO)

## 2013-06-18 MED ORDER — CYCLOBENZAPRINE HCL 10 MG PO TABS
10.0000 mg | ORAL_TABLET | Freq: Two times a day (BID) | ORAL | Status: DC | PRN
  Administered 2013-06-18: 10 mg via ORAL
  Filled 2013-06-18 (×2): qty 1

## 2013-06-18 MED ORDER — MAGNESIUM HYDROXIDE 400 MG/5ML PO SUSP: 15.0000 mL | Freq: Every day | ORAL | Status: DC | PRN

## 2013-06-18 MED ORDER — CYCLOBENZAPRINE HCL 10 MG PO TABS
10.0000 mg | ORAL_TABLET | Freq: Once | ORAL | Status: AC
  Administered 2013-06-18: 10 mg via ORAL
  Filled 2013-06-18: qty 1

## 2013-06-18 NOTE — Progress Notes (Signed)
Patient ID: Tara Farrell, female   DOB: 12/11/1980, 32 y.o.   MRN: [id number] HD#[id number] 25 6/7 weeks PTL with cerclage  S: History of LLQ pain , now improved after Flexeril Feels good. No perineal pain Denies contractions or bleeding. Occ brownish dc No LOF. Good FM.  No CP or SOB. No HA. No epigastric pain, no visual changes.   O: BP 128/83  Pulse 110  Temp(Src) 98 F (36.7 C) (Oral)  Resp 16  Ht 5\' 6"  (1.676 m)  Wt 110.315 kg (243 lb 3.2 oz)  BMI 39.27 kg/m2  SpO2 98%  LMP 12/19/2012  CBC    Component Value Date/Time   WBC 9.3 06/16/2013 0545   RBC 5.06 06/16/2013 0545   HGB 11.2* 06/16/2013 0545   HCT 35.1* 06/16/2013 0545   PLT 295 06/16/2013 0545   MCV 69.4* 06/16/2013 0545   MCH 22.1* 06/16/2013 0545   MCHC 31.9 06/16/2013 0545   RDW 15.9* 06/16/2013 0545   LYMPHSABS 1.6 06/04/2013 0551   MONOABS 0.6 06/04/2013 0551   EOSABS 0.0 06/04/2013 0551   BASOSABS 0.0 06/04/2013 0551     Lungs: CTA, no WRR CV: RRR, tachy Abd: soft gravid and non tender No CVAT SCDs intact Neuro : nonfocal SKin: intact [id number]  FHR 140-150s, fair BTBV, no recurrent decels, occ mild variable One tracing with occ prolonged decels noted. No contractions noted Reassuring NST x 3 noted but not reactive  Sono: EFW 528 gms(8/24) Cephalic, nl afi BPP 6/8  today  IMP: 25 6/7 weeks Acute PTL with cerclage and advanced dilatation. No evidence of chorio. Cerclage intact History of cervical insufficiency. History of intermittent prolonged decelerations on monitoring, reassuring daily BPPs Mildly elevated BP- no s/s PEC- labs nl 9/5- stable BP  P: BMZ series complete 8/25 MG neuroprotection done 8/24 Abx prophylaxis done NICU consult done Vaginal P4 200mg  qhs 1hr fetal monitoring q shift per MFM recommendation BPP noted and will do daily per recommendation

## 2013-06-18 NOTE — Progress Notes (Signed)
Obstetric ultrasound performed today.   Study limited to BPP.  Single IUP at 25 5/7 weeks Normal amniotic fluid volume BPP 8/8  Appropriate fetal growth on 8/24 (27th %tile)  Follow up evaluation of BPP recommended for non reactive fetal heart rate tracing.  If patient remains undelivered, recommend repeat evaluation of fetal growth in 1 week.   Please see full report in [facility].

## 2013-06-18 NOTE — Progress Notes (Signed)
Obstetric ultrasound performed today.   Study limited to evaluation of BPP.    Single IUP at 25 5/7 weeks Normal amniotic fluid volume Equivocal BPP (6/8 with no sustained fetal breathing noted)  Appropriate fetal growth on 8/24 (27th %tile)  Recommend coorelation of  BPP with fetal heart rate tracing.    If tracing is non reactive, recommend follow up BPP within 24 hours. If reactive, repeat BPPs as clinically indicated. If patient remains undelivered, recommend repeat evaluation of fetal growth in 1 week.   Please see full report in [facility].

## 2013-06-19 ENCOUNTER — Inpatient Hospital Stay (HOSPITAL_COMMUNITY)
Admit: 2013-06-19 | Discharge: 2013-06-19 | Disposition: A | Discharge status: Home / Self Care | Payer: Managed Care, Other (non HMO) | Attending: Obstetrics and Gynecology | Admitting: Obstetrics and Gynecology

## 2013-06-19 LAB — TYPE AND SCREEN
ABO/RH(D): A POS
Antibody Screen: NEGATIVE

## 2013-06-19 LAB — CBC
Hemoglobin: 11.8 g/dL — ABNORMAL LOW (ref 12.0–15.0)
MCHC: 33 g/dL (ref 30.0–36.0)
Platelets: 311 10*3/uL (ref 150–400)

## 2013-06-19 MED ORDER — MAGNESIUM SULFATE 40 G IN LACTATED RINGERS - SIMPLE
2.0000 g/h | INTRAVENOUS | Status: DC
  Administered 2013-06-19 – 2013-06-20 (×2): 2 g/h via INTRAVENOUS
  Filled 2013-06-19 (×2): qty 500

## 2013-06-19 MED ORDER — NIFEDIPINE 10 MG PO CAPS
20.0000 mg | ORAL_CAPSULE | Freq: Once | ORAL | Status: AC
  Administered 2013-06-19: 20 mg via ORAL
  Filled 2013-06-19: qty 2

## 2013-06-19 MED ORDER — NIFEDIPINE 10 MG PO CAPS
10.0000 mg | ORAL_CAPSULE | ORAL | Status: DC
  Administered 2013-06-19 – 2013-06-23 (×23): 10 mg via ORAL
  Filled 2013-06-19 (×23): qty 1

## 2013-06-19 MED ORDER — PROMETHAZINE HCL 25 MG/ML IJ SOLN
12.5000 mg | Freq: Once | INTRAMUSCULAR | Status: AC
  Administered 2013-06-19: 12.5 mg via INTRAVENOUS
  Filled 2013-06-19: qty 1

## 2013-06-19 MED ORDER — NIFEDIPINE 10 MG PO CAPS
10.0000 mg | ORAL_CAPSULE | ORAL | Status: AC
  Administered 2013-06-19: 10 mg via ORAL
  Filled 2013-06-19: qty 1

## 2013-06-19 MED ORDER — INDOMETHACIN 50 MG RE SUPP
100.0000 mg | Freq: Once | RECTAL | Status: AC
  Administered 2013-06-19: 100 mg via RECTAL
  Filled 2013-06-19: qty 2

## 2013-06-19 MED ORDER — MAGNESIUM SULFATE BOLUS VIA INFUSION
4.0000 g | Freq: Once | INTRAVENOUS | Status: AC
  Administered 2013-06-19: 4 g via INTRAVENOUS
  Filled 2013-06-19: qty 500

## 2013-06-19 MED ORDER — BUTORPHANOL TARTRATE 1 MG/ML IJ SOLN
1.0000 mg | Freq: Once | INTRAMUSCULAR | Status: AC
  Administered 2013-06-19: 1 mg via INTRAVENOUS

## 2013-06-19 MED ORDER — BUTORPHANOL TARTRATE 1 MG/ML IJ SOLN
INTRAMUSCULAR | Status: AC
  Filled 2013-06-19: qty 1

## 2013-06-19 MED ORDER — NIFEDIPINE 10 MG PO CAPS
10.0000 mg | ORAL_CAPSULE | Freq: Once | ORAL | Status: AC
  Administered 2013-06-19: 10 mg via ORAL
  Filled 2013-06-19: qty 1

## 2013-06-19 MED ORDER — INDOMETHACIN 25 MG PO CAPS
25.0000 mg | ORAL_CAPSULE | ORAL | Status: DC
  Administered 2013-06-19 – 2013-06-20 (×4): 25 mg via ORAL
  Filled 2013-06-19 (×7): qty 1

## 2013-06-19 MED ORDER — INDOMETHACIN 25 MG PO CAPS
25.0000 mg | ORAL_CAPSULE | Freq: Four times a day (QID) | ORAL | Status: DC
  Filled 2013-06-19: qty 1

## 2013-06-19 MED ORDER — INDOMETHACIN 25 MG PO CAPS
25.0000 mg | ORAL_CAPSULE | Freq: Four times a day (QID) | ORAL | Status: DC
  Administered 2013-06-19: 25 mg via ORAL
  Filled 2013-06-19: qty 1

## 2013-06-19 NOTE — Progress Notes (Signed)
Patient ID: Tara Farrell, female   DOB: Jan 17, 1981, 32 y.o.   MRN: [id number] HD#[id number] 26 0/7 weeks PTL with cerclage  S: History of LLQ pain , now improved after Flexeril Increased frequency of painful contractions this am every 3-5 min Occ brownish dc No LOF. Good FM.  No CP or SOB. No HA. No epigastric pain, no visual changes.   O: BP 132/74  Pulse 94  Temp(Src) 98.6 F (37 C) (Oral)  Resp 18  Ht 5\' 6"  (1.676 m)  Wt 110.315 kg (243 lb 3.2 oz)  BMI 39.27 kg/m2  SpO2 98%  LMP 12/19/2012  CBC    Component Value Date/Time   WBC 9.3 06/16/2013 0545   RBC 5.06 06/16/2013 0545   HGB 11.2* 06/16/2013 0545   HCT 35.1* 06/16/2013 0545   PLT 295 06/16/2013 0545   MCV 69.4* 06/16/2013 0545   MCH 22.1* 06/16/2013 0545   MCHC 31.9 06/16/2013 0545   RDW 15.9* 06/16/2013 0545   LYMPHSABS 1.6 06/04/2013 0551   MONOABS 0.6 06/04/2013 0551   EOSABS 0.0 06/04/2013 0551   BASOSABS 0.0 06/04/2013 0551     Lungs: CTA, no WRR CV: RRR, tachy Abd: soft gravid and non tender No CVAT SCDs intact Neuro : nonfocal SKin: intact [id number]- no bleeding  FHR 140-150s, fair BTBV, no recurrent decels, occ mild variable Regular contractions noted every 3-59min Reassuring NST x 3 noted but not reactive  Sono: EFW 528 gms(8/24)   IMP: 26 0/7 weeks Acute PTL with cerclage and advanced dilatation. No evidence of chorio. Cerclage intact History of cervical insufficiency. History of intermittent prolonged decelerations on monitoring, reassuring daily BPPs Mildly elevated BP- no s/s PEC- labs nl 9/5- stable BP  P: Acute tocolysis with Procardia and Indocin Resume Mg for neuroprotection BMZ series complete 8/25 GBS negative Abx prophylaxis done NICU consult done Vaginal P4 200mg  qhs BPP noted and will do daily per recommendation

## 2013-06-19 NOTE — Progress Notes (Signed)
Patient ID: Tara Farrell, female   DOB: 1980-11-07, 32 y.o.   MRN: [id number] Feeling better. Sleeping through contractions. No SOB or CP. Good FM No bleeding or change in dc.  BP 132/75  Pulse 100  Temp(Src) 98.4 F (36.9 C) (Oral)  Resp 18  Ht 5\' 6"  (1.676 m)  Wt 110.315 kg (243 lb 3.2 oz)  BMI 39.27 kg/m2  SpO2 98%  LMP 12/19/2012  Lungs: CTA CV: RRR Abd: gravid and nontender.  FHT 140-150 Fair BTBV No decels Category 1 tracing  IMP: Acute PTL  P: MgSO4 for neuroprotection Procardia Indocin Continuous EFM and TOCO

## 2013-06-19 NOTE — Progress Notes (Signed)
Updated per MD request, cont with 4th dose of procardia

## 2013-06-19 NOTE — Progress Notes (Signed)
[facility] ultrasound  Indication: 32 yr old G3P0020 at [redacted]w[redacted]d with preterm labor and cerclage for biophysical profile for nonreactive NST.  Findings: 1. Single intrauterine pregnancy. 2. Posterior placenta without evidence of previa. 3. Normal amniotic fluid volume. 4. Normal biophysical profile of 8/8.  Recommendations: 1. Preterm labor: - s/p betamethasone - previously counseled - is currently on indocin and procardia- recommend close surveillance for pulmonary edema with use of multiple tocolytic agents - on magnesium sulfate for fetal neuroprotection- if does not deliver within 12 hours recommend discontinue and restart if delivery is imminent - cerclage still in place; recommend low threshold for removal 2. Recommend fetal growth in 1-2 weeks 3. Repeat BPP only in the setting of nonreactive NST (use 10x10 accels since <28 weeks)  Eulis Foster, MD

## 2013-06-19 NOTE — Progress Notes (Signed)
Patient ID: Tara Farrell, female   DOB: [birth year], 32 y.o.   MRN: [id number] Feeling better. No further contractions. No SOB or CP. Good FM No bleeding or change in dc.  BP 139/82  Pulse 107  Temp(Src) 97.9 F (36.6 C) (Oral)  Resp 20  Ht 5\' 6"  (1.676 m)  Wt 110.315 kg (243 lb 3.2 oz)  BMI 39.27 kg/m2  SpO2 100%  LMP 12/19/2012  Lungs: CTA CV: RRR Abd: gravid and nontender.  FHT 140-150 Fair BTBV No decels Category 1 tracing  IMP: Acute PTL  P: MgSO4 for neuroprotection- dc in am Procardia q 4h Indocin q 4h x 48-72h Continuous EFM and TOCO

## 2013-06-19 NOTE — Progress Notes (Signed)
U/S tech in to do BPP at bedside

## 2013-06-19 NOTE — Progress Notes (Signed)
Ur chart review completed.  

## 2013-06-20 ENCOUNTER — Ambulatory Visit (HOSPITAL_COMMUNITY): Admit: 2013-06-20 | Payer: Managed Care, Other (non HMO)

## 2013-06-20 MED ORDER — INDOMETHACIN 25 MG PO CAPS
25.0000 mg | ORAL_CAPSULE | Freq: Four times a day (QID) | ORAL | Status: DC
  Administered 2013-06-20 – 2013-06-21 (×7): 25 mg via ORAL
  Filled 2013-06-20 (×9): qty 1

## 2013-06-20 NOTE — Progress Notes (Signed)
Patient ID: Tara Farrell, female   DOB: [birth year], 32 y.o.   MRN: [id number] HD#[id number] 26 1/7 weeks PTL with cerclage  S: Contractions now ceased. Responded to tocolytics Occ brownish dc No LOF. Good FM.  No CP or SOB. No HA. No epigastric pain, no visual changes.   O: BP 115/65  Pulse 101  Temp(Src) 98.2 F (36.8 C) (Oral)  Resp 24  Ht 5\' 6"  (1.676 m)  Wt 110.315 kg (243 lb 3.2 oz)  BMI 39.27 kg/m2  SpO2 100%  LMP 12/19/2012  CBC    Component Value Date/Time   WBC 11.3* 06/19/2013 0925   RBC 5.34* 06/19/2013 0925   HGB 11.8* 06/19/2013 0925   HCT 35.8* 06/19/2013 0925   PLT 311 06/19/2013 0925   MCV 67.0* 06/19/2013 0925   MCH 22.1* 06/19/2013 0925   MCHC 33.0 06/19/2013 0925   RDW 15.8* 06/19/2013 0925   LYMPHSABS 1.6 06/04/2013 0551   MONOABS 0.6 06/04/2013 0551   EOSABS 0.0 06/04/2013 0551   BASOSABS 0.0 06/04/2013 0551     HEENT: nl Neck: supple with FROM Lungs: CTA, no WRR CV: RRR, tachy Abd: soft gravid and non tender No CVAT SCDs intact Neuro : nonfocal SKin: intact WU:[id number]- no bleeding  FHR 140-150s, fair BTBV, no recurrent decels, occ mild variable No contractions, rare UI Reassuring NST x 3 noted but not reactive  Sono: EFW 528 gms(8/24) BPP 8/8 yesterday   IMP: 26 1/7 weeks Acute PTL with cerclage and advanced dilatation. No evidence of chorio. Cerclage intact History of cervical insufficiency. History of intermittent prolonged decelerations on monitoring, reassuring daily BPPs Mildly elevated BP- no s/s PEC- labs nl 9/5- stable BP now  P: Acute tocolysis with Procardia and Indocin Will DC Indocin after 48 -72 hrs and change current frequency. Mg  neuroprotection-done(rpt course) BMZ series complete 8/25 GBS negative(8/24) Abx prophylaxis done NICU consult done Vaginal P4 200mg  qhs BPP noted and will do daily per recommendation

## 2013-06-20 NOTE — Progress Notes (Signed)
Patient ID: Tara Farrell, female   DOB: November 29, 1980, 32 y.o.   MRN: [id number] Pt comfortable. No contractions. No bleeding. No LOF. BP 128/74  Pulse 106  Temp(Src) 98.6 F (37 C) (Oral)  Resp 18  Ht 5\' 6"  (1.676 m)  Wt 110.315 kg (243 lb 3.2 oz)  BMI 39.27 kg/m2  SpO2 100%  LMP 12/19/2012  NSTs reviewed. FHT 140-150 with mod BTBV, occ 10x10 accels and occ variable and occ prolonged decels noted. Daily BPP cancelled by MFM today after their review of tracing. BPP 8/8 yesterday. Will continue with NST q shift and order BPP frequency per MFM recommendation. Continue tocolysis.

## 2013-06-21 ENCOUNTER — Inpatient Hospital Stay (HOSPITAL_COMMUNITY)
Admit: 2013-06-21 | Discharge: 2013-06-21 | Disposition: A | Discharge status: Home / Self Care | Payer: Managed Care, Other (non HMO) | Attending: Obstetrics and Gynecology | Admitting: Obstetrics and Gynecology

## 2013-06-21 NOTE — Progress Notes (Signed)
Ur chart review completed per request.  

## 2013-06-21 NOTE — Progress Notes (Signed)
Patient ID: Tara Farrell, female   DOB: 12-23-1980, 32 y.o.   MRN: [id number] HD#[id number] 26 2/7 weeks PTL with cerclage  S: Denies Contractions  Responded to tocolytics Occ brownish dc No LOF. Good FM.  No CP or SOB. No HA. No epigastric pain, no visual changes.   O: BP 125/71  Pulse 103  Temp(Src) 98.2 F (36.8 C) (Oral)  Resp 18  Ht 5\' 6"  (1.676 m)  Wt 110.315 kg (243 lb 3.2 oz)  BMI 39.27 kg/m2  SpO2 100%  LMP 12/19/2012  CBC    Component Value Date/Time   WBC 11.3* 06/19/2013 0925   RBC 5.34* 06/19/2013 0925   HGB 11.8* 06/19/2013 0925   HCT 35.8* 06/19/2013 0925   PLT 311 06/19/2013 0925   MCV 67.0* 06/19/2013 0925   MCH 22.1* 06/19/2013 0925   MCHC 33.0 06/19/2013 0925   RDW 15.8* 06/19/2013 0925   LYMPHSABS 1.6 06/04/2013 0551   MONOABS 0.6 06/04/2013 0551   EOSABS 0.0 06/04/2013 0551   BASOSABS 0.0 06/04/2013 0551     HEENT: nl Neck: supple with FROM Lungs: CTA, no WRR CV: RRR, tachy Abd: soft gravid and non tender No CVAT SCDs intact Neuro : nonfocal SKin: intact HY:QMVHQION- no bleeding  FHR 140-150s, fair BTBV, no recurrent decels, occ mild variable, occ prolonged decel No contractions, rare UI Reassuring NST x 3 noted but not reactive. Some tracings show 10x10 accels  Sono: EFW 528 gms(8/24) BPP 8/8 on 9/8   IMP: 26 2/7 weeks Acute PTL with cerclage and advanced dilatation. No evidence of chorio. Cerclage intact History of cervical insufficiency. History of intermittent prolonged decelerations on monitoring, reassuring daily BPPs Mildly elevated BP- no s/s PEC- labs nl 9/5- stable BP now  P: Acute tocolysis with Procardia and Indocin- successful Will DC Indocin after 48 -72 hrs and change current frequency. Mg  neuroprotection-done(rpt course) BMZ series complete 8/25 GBS negative(8/24) Abx prophylaxis done NICU consult done Vaginal P4 200mg  qhs BPP noted and will do daily per recommendation

## 2013-06-22 ENCOUNTER — Inpatient Hospital Stay (HOSPITAL_COMMUNITY): Payer: Managed Care, Other (non HMO)

## 2013-06-22 LAB — TYPE AND SCREEN
ABO/RH(D): A POS
Antibody Screen: NEGATIVE

## 2013-06-22 MED ORDER — INDOMETHACIN 25 MG PO CAPS
25.0000 mg | ORAL_CAPSULE | Freq: Four times a day (QID) | ORAL | Status: AC
  Administered 2013-06-22 (×2): 25 mg via ORAL
  Filled 2013-06-22 (×2): qty 1

## 2013-06-22 NOTE — Progress Notes (Signed)
Patient ID: Tara Farrell, female   DOB: 1981/07/16, 32 y.o.   MRN: [id number] HD#[id number] 26 3/7 weeks PTL with cerclage  S: Denies Contractions  Responded to tocolytics Occ brownish dc No LOF. Good FM.  No CP or SOB. No HA. No epigastric pain, no visual changes.   O: BP 127/79  Pulse 107  Temp(Src) 98 F (36.7 C) (Oral)  Resp 20  Ht 5\' 6"  (1.676 m)  Wt 107.502 kg (237 lb)  BMI 38.27 kg/m2  SpO2 99%  LMP 12/19/2012  CBC    Component Value Date/Time   WBC 11.3* 06/19/2013 0925   RBC 5.34* 06/19/2013 0925   HGB 11.8* 06/19/2013 0925   HCT 35.8* 06/19/2013 0925   PLT 311 06/19/2013 0925   MCV 67.0* 06/19/2013 0925   MCH 22.1* 06/19/2013 0925   MCHC 33.0 06/19/2013 0925   RDW 15.8* 06/19/2013 0925   LYMPHSABS 1.6 06/04/2013 0551   MONOABS 0.6 06/04/2013 0551   EOSABS 0.0 06/04/2013 0551   BASOSABS 0.0 06/04/2013 0551     HEENT: nl Neck: supple with FROM Lungs: CTA, no WRR CV: RRR, tachy Abd: soft gravid and non tender No CVAT SCDs intact Neuro : nonfocal SKin: intact WU:[id number]- no bleeding  FHR 140-150s, fair BTBV, no recurrent decels, occ mild variable, occ prolonged decel, 10x10 accels noted No contractions, rare UI Reassuring NST x 3 noted but not reactive. Some tracings show 10x10 accels  Sono: EFW 528 gms(8/24) BPP 8/8 on 9/10   IMP: 26 3/7 weeks Acute PTL with cerclage and advanced dilatation. No evidence of chorio. Cerclage intact History of cervical insufficiency. History of intermittent prolonged decelerations on monitoring, reassuring daily BPPs Mildly elevated BP- no s/s PEC- labs nl 9/5- stable BP now  P: Acute tocolysis with Procardia and Indocin- successful Will DC Indocin after 72 hrs  Mg  neuroprotection-done(rpt course) BMZ series complete 8/25 GBS negative(8/24) Abx prophylaxis done NICU consult done Vaginal P4 200mg  qhs BPP noted and will do daily per recommendation

## 2013-06-22 NOTE — Progress Notes (Addendum)
MD updated on morning FHR strip and verified that her wanted a BPP today. MFM notified

## 2013-06-23 ENCOUNTER — Ambulatory Visit (HOSPITAL_COMMUNITY): Payer: 59

## 2013-06-23 ENCOUNTER — Inpatient Hospital Stay (HOSPITAL_COMMUNITY): Payer: Managed Care, Other (non HMO)

## 2013-06-23 ENCOUNTER — Encounter (HOSPITAL_COMMUNITY): Payer: Self-pay | Admitting: Anesthesiology

## 2013-06-23 ENCOUNTER — Encounter (HOSPITAL_COMMUNITY): Payer: Self-pay | Admitting: *Deleted

## 2013-06-23 ENCOUNTER — Inpatient Hospital Stay (HOSPITAL_COMMUNITY): Payer: Managed Care, Other (non HMO) | Admitting: Anesthesiology

## 2013-06-23 LAB — CBC
HCT: 35.2 % — ABNORMAL LOW (ref 36.0–46.0)
Hemoglobin: 11.4 g/dL — ABNORMAL LOW (ref 12.0–15.0)
MCH: 22.2 pg — ABNORMAL LOW (ref 26.0–34.0)
MCV: 68.6 fL — ABNORMAL LOW (ref 78.0–100.0)
Platelets: 242 10*3/uL (ref 150–400)
RBC: 5.13 MIL/uL — ABNORMAL HIGH (ref 3.87–5.11)

## 2013-06-23 MED ORDER — NIFEDIPINE 10 MG PO CAPS
ORAL_CAPSULE | ORAL | Status: AC
  Administered 2013-06-23: 10 mg
  Filled 2013-06-23: qty 2

## 2013-06-23 MED ORDER — LACTATED RINGERS IV SOLN: INTRAVENOUS | Status: DC

## 2013-06-23 MED ORDER — ONDANSETRON HCL 4 MG/2ML IJ SOLN: 4.0000 mg | INTRAMUSCULAR | Status: DC | PRN

## 2013-06-23 MED ORDER — BUTORPHANOL TARTRATE 1 MG/ML IJ SOLN
INTRAMUSCULAR | Status: AC
  Filled 2013-06-23: qty 1

## 2013-06-23 MED ORDER — OXYTOCIN 40 UNITS IN LACTATED RINGERS INFUSION - SIMPLE MED
62.5000 mL/h | Freq: Once | INTRAVENOUS | Status: DC | PRN
  Filled 2013-06-23: qty 1000

## 2013-06-23 MED ORDER — DIPHENHYDRAMINE HCL 25 MG PO CAPS: 25.0000 mg | ORAL_CAPSULE | Freq: Four times a day (QID) | ORAL | Status: DC | PRN

## 2013-06-23 MED ORDER — PHENYLEPHRINE 40 MCG/ML (10ML) SYRINGE FOR IV PUSH (FOR BLOOD PRESSURE SUPPORT)
PREFILLED_SYRINGE | INTRAVENOUS | Status: AC
  Administered 2013-06-23: 80 ug via INTRAVENOUS
  Filled 2013-06-23: qty 5

## 2013-06-23 MED ORDER — FENTANYL 2.5 MCG/ML BUPIVACAINE 1/10 % EPIDURAL INFUSION (WH - ANES)
14.0000 mL/h | INTRAMUSCULAR | Status: DC | PRN
  Administered 2013-06-23: 14 mL/h via EPIDURAL

## 2013-06-23 MED ORDER — NIFEDIPINE ER 30 MG PO TB24
30.0000 mg | ORAL_TABLET | Freq: Two times a day (BID) | ORAL | Status: DC
  Filled 2013-06-23 (×3): qty 1

## 2013-06-23 MED ORDER — PHENYLEPHRINE 40 MCG/ML (10ML) SYRINGE FOR IV PUSH (FOR BLOOD PRESSURE SUPPORT)
80.0000 ug | PREFILLED_SYRINGE | INTRAVENOUS | Status: DC | PRN
  Administered 2013-06-23: 80 ug via INTRAVENOUS
  Filled 2013-06-23: qty 2

## 2013-06-23 MED ORDER — OXYTOCIN BOLUS FROM INFUSION
500.0000 mL | Freq: Once | INTRAVENOUS | Status: AC | PRN
  Administered 2013-06-23: 500 mL via INTRAVENOUS

## 2013-06-23 MED ORDER — ONDANSETRON HCL 4 MG/2ML IJ SOLN: 4.0000 mg | Freq: Four times a day (QID) | INTRAMUSCULAR | Status: DC | PRN

## 2013-06-23 MED ORDER — LACTATED RINGERS IV SOLN: 500.0000 mL | Freq: Once | INTRAVENOUS | Status: DC

## 2013-06-23 MED ORDER — BENZOCAINE-MENTHOL 20-0.5 % EX AERO
1.0000 "application " | INHALATION_SPRAY | CUTANEOUS | Status: DC | PRN
  Administered 2013-06-23: 1 via TOPICAL
  Filled 2013-06-23: qty 56

## 2013-06-23 MED ORDER — OXYCODONE-ACETAMINOPHEN 5-325 MG PO TABS: 1.0000 | ORAL_TABLET | ORAL | Status: DC | PRN

## 2013-06-23 MED ORDER — ONDANSETRON HCL 4 MG PO TABS: 4.0000 mg | ORAL_TABLET | ORAL | Status: DC | PRN

## 2013-06-23 MED ORDER — SIMETHICONE 80 MG PO CHEW: 80.0000 mg | CHEWABLE_TABLET | ORAL | Status: DC | PRN

## 2013-06-23 MED ORDER — LACTATED RINGERS IV SOLN
INTRAVENOUS | Status: DC
  Administered 2013-06-23: 14:00:00 via INTRAUTERINE

## 2013-06-23 MED ORDER — DIBUCAINE 1 % RE OINT: 1.0000 "application " | TOPICAL_OINTMENT | RECTAL | Status: DC | PRN

## 2013-06-23 MED ORDER — LIDOCAINE HCL (PF) 1 % IJ SOLN
INTRAMUSCULAR | Status: DC | PRN
  Administered 2013-06-23 (×2): 5 mL

## 2013-06-23 MED ORDER — LACTATED RINGERS IV BOLUS (SEPSIS)
500.0000 mL | Freq: Once | INTRAVENOUS | Status: AC
  Administered 2013-06-23: 11:00:00 via INTRAVENOUS

## 2013-06-23 MED ORDER — CITRIC ACID-SODIUM CITRATE 334-500 MG/5ML PO SOLN
30.0000 mL | ORAL | Status: DC | PRN
  Filled 2013-06-23: qty 15

## 2013-06-23 MED ORDER — EPHEDRINE 5 MG/ML INJ
10.0000 mg | INTRAVENOUS | Status: DC | PRN
  Filled 2013-06-23: qty 2

## 2013-06-23 MED ORDER — BUTORPHANOL TARTRATE 1 MG/ML IJ SOLN
1.0000 mg | Freq: Once | INTRAMUSCULAR | Status: AC
  Administered 2013-06-23: 1 mg via INTRAVENOUS

## 2013-06-23 MED ORDER — NIFEDIPINE 10 MG PO CAPS
10.0000 mg | ORAL_CAPSULE | Freq: Three times a day (TID) | ORAL | Status: DC
  Filled 2013-06-23 (×3): qty 1

## 2013-06-23 MED ORDER — ONDANSETRON HCL 4 MG/2ML IJ SOLN: 4.0000 mg | Freq: Once | INTRAMUSCULAR | Status: DC

## 2013-06-23 MED ORDER — IBUPROFEN 600 MG PO TABS
600.0000 mg | ORAL_TABLET | Freq: Four times a day (QID) | ORAL | Status: DC
  Administered 2013-06-23 – 2013-06-25 (×8): 600 mg via ORAL
  Filled 2013-06-23 (×9): qty 1

## 2013-06-23 MED ORDER — WITCH HAZEL-GLYCERIN EX PADS: 1.0000 "application " | MEDICATED_PAD | CUTANEOUS | Status: DC | PRN

## 2013-06-23 MED ORDER — SENNOSIDES-DOCUSATE SODIUM 8.6-50 MG PO TABS
2.0000 | ORAL_TABLET | ORAL | Status: DC
  Administered 2013-06-23 – 2013-06-25 (×2): 2 via ORAL

## 2013-06-23 MED ORDER — PHENYLEPHRINE 40 MCG/ML (10ML) SYRINGE FOR IV PUSH (FOR BLOOD PRESSURE SUPPORT)
80.0000 ug | PREFILLED_SYRINGE | INTRAVENOUS | Status: DC | PRN
  Filled 2013-06-23: qty 2

## 2013-06-23 MED ORDER — FENTANYL 2.5 MCG/ML BUPIVACAINE 1/10 % EPIDURAL INFUSION (WH - ANES)
INTRAMUSCULAR | Status: AC
  Filled 2013-06-23: qty 125

## 2013-06-23 MED ORDER — NIFEDIPINE 10 MG PO CAPS
20.0000 mg | ORAL_CAPSULE | Freq: Once | ORAL | Status: AC
  Administered 2013-06-23: 20 mg via ORAL

## 2013-06-23 MED ORDER — NIFEDIPINE 10 MG PO CAPS
ORAL_CAPSULE | ORAL | Status: AC
  Filled 2013-06-23: qty 1

## 2013-06-23 MED ORDER — LACTATED RINGERS IV SOLN: 500.0000 mL | INTRAVENOUS | Status: DC | PRN

## 2013-06-23 MED ORDER — EPHEDRINE 5 MG/ML INJ
INTRAVENOUS | Status: AC
  Filled 2013-06-23: qty 4

## 2013-06-23 MED ORDER — LANOLIN HYDROUS EX OINT: TOPICAL_OINTMENT | CUTANEOUS | Status: DC | PRN

## 2013-06-23 MED ORDER — DIPHENHYDRAMINE HCL 50 MG/ML IJ SOLN: 12.5000 mg | INTRAMUSCULAR | Status: DC | PRN

## 2013-06-23 MED ORDER — ACETAMINOPHEN 325 MG PO TABS: 650.0000 mg | ORAL_TABLET | ORAL | Status: DC | PRN

## 2013-06-23 MED ORDER — LIDOCAINE HCL (PF) 1 % IJ SOLN
30.0000 mL | INTRAMUSCULAR | Status: DC | PRN
  Filled 2013-06-23 (×2): qty 30

## 2013-06-23 MED ORDER — IBUPROFEN 600 MG PO TABS: 600.0000 mg | ORAL_TABLET | Freq: Four times a day (QID) | ORAL | Status: DC | PRN

## 2013-06-23 NOTE — Lactation Note (Signed)
This note was copied from the chart of Tara Farrell. Lactation Consultation Note   Initial consult with this mom of a NICU baby, now 5 hourspoat partum. Baby is 26 4/[redacted] weeks gestation. Mom haas been in hospital since 06/04/13. Baby was delivered vaginally. Mom wants to provide breast milk, so i started her pumpingwith a DEP, in premie setting, and showed mom how to hand express. Mom has very large breasts, with evert nipples. I fitted her for the 24 flanges, but cautioned mom to increse to 27 if her nipples swell. She was able to express about 1 ml of colsotrum, which mom and dad will bring to the NICU. Basic teaching done from the NICU book on p[roviding breast milk, pumping, and hand expression. Skin to skin encouraged as soon as the baby is stable enough. Mom has private insurance, and knows to call for a DEP. She will need to rent a DEP on her discharge this weekend. I will follow this family in the nICU.  Patient Name: Tara Tracey Hermance LKGMW'N Date: 06/23/2013 Reason for consult: Initial assessment;NICU baby   Maternal Data Formula Feeding for Exclusion: Yes (baby in NICU) Infant to breast within first hour of birth: No Breastfeeding delayed due to:: Infant status Has patient been taught Hand Expression?: Yes Does the patient have breastfeeding experience prior to this delivery?: No  Feeding    LATCH Score/Interventions                      Lactation Tools Discussed/Used Tools: Pump Breast pump type: Double-Electric Breast Pump WIC Program: No Pump Review: Setup, frequency, and cleaning;Milk Storage;Other (comment) (hand exporession, teaching on prov EBM in NICU) Initiated by:: c Jalaiya Oyster Date initiated:: 06/23/13 (at 2030, 5 1/2 hours after delivery)   Consult Status Consult Status: Follow-up Date: 06/24/13 Follow-up type: In-patient (in NICU)    Alfred Levins 06/23/2013, 8:57 PM

## 2013-06-23 NOTE — Progress Notes (Signed)
nicu charge nurse updated on pt status

## 2013-06-23 NOTE — Progress Notes (Signed)
Pt moved to L&D rm 163 and report to Herma Carson RN , NICU notified of pt transfer to L&D

## 2013-06-23 NOTE — Progress Notes (Signed)
I received a referral from RN on [employer].  Tara Farrell was 7 cm dilated and so I kept our visit short.  I introduced myself and offered initial support.  Patient and family are both aware of on-going availability of chaplain support.  Please page as needs arise or as pt/family requests, [phone].  Chaplain Dyanne Carrel 2:37 PM

## 2013-06-23 NOTE — Progress Notes (Signed)
Lucky Alverson is a 32 y.o. G3P0020 at [redacted]w[redacted]d by LMP admitted for active labor Unable to stop contractions with Procardia. Decision made to proceed with cerclage removal  Subjective: Felt pressure now better after epidural  Objective: BP 127/66  Pulse 128  Temp(Src) 99.2 F (37.3 C) (Oral)  Resp 16  Ht 5\' 6"  (1.676 m)  Wt 107.502 kg (237 lb)  BMI 38.27 kg/m2  SpO2 99%  LMP 12/19/2012      FHT:  FHR: 145 bpm, variability: minimal ,  accelerations:  Present,  decelerations:  Present occ variable with contractions UC:   regular, every 3 minutes SVE:   Dilation: 6 Exam by:: Daylen Lipsky AROM - clear Cerclage removed with long ring forceps and long scissors. Removed intact. No complications. Minimal bleeding noted.  Labs: Lab Results  Component Value Date   WBC 8.5 06/23/2013   HGB 11.4* 06/23/2013   HCT 35.2* 06/23/2013   MCV 68.6* 06/23/2013   PLT 242 06/23/2013    Assessment / Plan: Spontaneous labor, progressing normally Preterm- NICU aware. BMZ complete. Mg neuroprotection complete.  Labor: Progressing normally Preeclampsia:  labs stable Fetal Wellbeing:  Category I Pain Control:  Epidural I/D:  n/a Anticipated MOD:  NSVD  Brees Hounshell J 06/23/2013, 1:00 PM

## 2013-06-23 NOTE — Anesthesia Preprocedure Evaluation (Signed)
Anesthesia Evaluation  Patient identified by MRN, date of birth, ID band Patient awake    Reviewed: Allergy & Precautions, H&P , Patient's Chart, lab work & pertinent test results  Airway Mallampati: III TM Distance: >3 FB Neck ROM: full    Dental no notable dental hx.    Pulmonary neg pulmonary ROS,  breath sounds clear to auscultation  Pulmonary exam normal       Cardiovascular negative cardio ROS  Rhythm:regular Rate:Normal     Neuro/Psych negative neurological ROS  negative psych ROS   GI/Hepatic negative GI ROS, Neg liver ROS,   Endo/Other  negative endocrine ROSMorbid obesity  Renal/GU negative Renal ROS     Musculoskeletal   Abdominal   Peds  Hematology negative hematology ROS (+)   Anesthesia Other Findings   Reproductive/Obstetrics (+) Pregnancy                           Anesthesia Physical Anesthesia Plan  ASA: III  Anesthesia Plan: Epidural   Post-op Pain Management:    Induction:   Airway Management Planned:   Additional Equipment:   Intra-op Plan:   Post-operative Plan:   Informed Consent: I have reviewed the patients History and Physical, chart, labs and discussed the procedure including the risks, benefits and alternatives for the proposed anesthesia with the patient or authorized representative who has indicated his/her understanding and acceptance.     Plan Discussed with:   Anesthesia Plan Comments:         Anesthesia Quick Evaluation

## 2013-06-23 NOTE — Progress Notes (Signed)
Patient ID: Ellan Tess, female   DOB: July 01, 1981, 32 y.o.   MRN: [id number] HD#[id number] 26 4/7 weeks PTL with cerclage  S: Denies Contractions  Responded to tocolytics Occ brownish dc No LOF. Good FM.  No CP or SOB. No HA. No epigastric pain, no visual changes.   O: BP 124/73  Pulse 103  Temp(Src) 99.2 F (37.3 C) (Oral)  Resp 18  Ht 5\' 6"  (1.676 m)  Wt 107.502 kg (237 lb)  BMI 38.27 kg/m2  SpO2 99%  LMP 12/19/2012  CBC    Component Value Date/Time   WBC 11.3* 06/19/2013 0925   RBC 5.34* 06/19/2013 0925   HGB 11.8* 06/19/2013 0925   HCT 35.8* 06/19/2013 0925   PLT 311 06/19/2013 0925   MCV 67.0* 06/19/2013 0925   MCH 22.1* 06/19/2013 0925   MCHC 33.0 06/19/2013 0925   RDW 15.8* 06/19/2013 0925   LYMPHSABS 1.6 06/04/2013 0551   MONOABS 0.6 06/04/2013 0551   EOSABS 0.0 06/04/2013 0551   BASOSABS 0.0 06/04/2013 0551     HEENT: nl Neck: supple with FROM Lungs: CTA, no WRR CV: RRR, tachy Abd: soft gravid and non tender No CVAT SCDs intact Neuro : nonfocal SKin: intact VE:2-3/80/vertex/cerclage intact/-1  FHR 140-150s, fair BTBV, no recurrent decels, occ mild variable, occ prolonged decel, 10x10 accels noted No contractions, rare UI Reassuring NST x 3 noted but not reactive. Some tracings show 10x10 accels  Sono: EFW 528 gms(8/24) BPP 8/8 on 9/11   IMP: 26 4/7 weeks Acute PTL with cerclage and advanced dilatation. No evidence of chorio. Cerclage intact History of cervical insufficiency. History of intermittent prolonged decelerations on monitoring, reassuring  BPPs Mildly elevated BP- no s/s PEC- labs nl 9/5- stable BP now  P: Acute tocolysis with Procardia and Indocin- successful Will change Procardia to XL 30mg  bid  Mg  neuroprotection-done(rpt course) BMZ series complete 8/25 GBS negative(8/24) Abx prophylaxis done NICU consult done Vaginal P4 200mg  qhs BPP noted and will do daily per recommendation Sono for growth 9/15

## 2013-06-23 NOTE — Anesthesia Procedure Notes (Signed)
Epidural Patient location during procedure: OB Start time: 06/23/2013 12:14 PM  Staffing Anesthesiologist: Angus Seller., Harrell Gave. Performed by: anesthesiologist   Preanesthetic Checklist Completed: patient identified, site marked, surgical consent, pre-op evaluation, timeout performed, IV checked, risks and benefits discussed and monitors and equipment checked  Epidural Patient position: sitting Prep: site prepped and draped and DuraPrep Patient monitoring: continuous pulse ox and blood pressure Approach: midline Injection technique: LOR air and LOR saline  Needle:  Needle type: Tuohy  Needle gauge: 17 G Needle length: 9 cm and 9 Needle insertion depth: 8 cm Catheter type: closed end flexible Catheter size: 19 Gauge Catheter at skin depth: 14 cm Test dose: negative  Assessment Events: blood not aspirated, injection not painful, no injection resistance, negative IV test and no paresthesia  Additional Notes Patient identified.  Risk benefits discussed including failed block, incomplete pain control, headache, nerve damage, paralysis, blood pressure changes, nausea, vomiting, reactions to medication both toxic or allergic, and postpartum back pain.  Patient expressed understanding and wished to proceed.  All questions were answered.  Sterile technique used throughout procedure and epidural site dressed with sterile barrier dressing. No paresthesia or other complications noted.The patient did not experience any signs of intravascular injection such as tinnitus or metallic taste in mouth nor signs of intrathecal spread such as rapid motor block. Please see nursing notes for vital signs.

## 2013-06-24 ENCOUNTER — Encounter (HOSPITAL_COMMUNITY): Payer: Self-pay | Admitting: *Deleted

## 2013-06-24 LAB — CBC
HCT: 30.4 % — ABNORMAL LOW (ref 36.0–46.0)
Hemoglobin: 10 g/dL — ABNORMAL LOW (ref 12.0–15.0)
MCH: 22.7 pg — ABNORMAL LOW (ref 26.0–34.0)
MCHC: 32.9 g/dL (ref 30.0–36.0)
MCV: 68.9 fL — ABNORMAL LOW (ref 78.0–100.0)
Platelets: 231 10*3/uL (ref 150–400)
RBC: 4.41 MIL/uL (ref 3.87–5.11)
RDW: 15.4 % (ref 11.5–15.5)
WBC: 8 10*3/uL (ref 4.0–10.5)

## 2013-06-24 MED ORDER — TETANUS-DIPHTH-ACELL PERTUSSIS 5-2.5-18.5 LF-MCG/0.5 IM SUSP
0.5000 mL | Freq: Once | INTRAMUSCULAR | Status: AC
  Administered 2013-06-24: 0.5 mL via INTRAMUSCULAR
  Filled 2013-06-24: qty 0.5

## 2013-06-24 MED ORDER — GUAIFENESIN 100 MG/5ML PO SYRP
200.0000 mg | ORAL_SOLUTION | ORAL | Status: DC | PRN
  Filled 2013-06-24: qty 10

## 2013-06-24 MED ORDER — GUAIFENESIN 100 MG/5ML PO SOLN
10.0000 mL | ORAL | Status: DC | PRN
  Administered 2013-06-24 – 2013-06-25 (×2): 200 mg via ORAL
  Filled 2013-06-24: qty 15

## 2013-06-24 MED ORDER — SALINE SPRAY 0.65 % NA SOLN
1.0000 | NASAL | Status: DC | PRN
  Administered 2013-06-24: 1 via NASAL
  Filled 2013-06-24: qty 44

## 2013-06-24 MED ORDER — COMPLETENATE 29-1 MG PO CHEW
1.0000 | CHEWABLE_TABLET | Freq: Once | ORAL | Status: AC
  Administered 2013-06-24: 1 via ORAL
  Filled 2013-06-24: qty 1

## 2013-06-24 MED ORDER — COMPLETENATE 29-1 MG PO CHEW
1.0000 | CHEWABLE_TABLET | Freq: Every day | ORAL | Status: DC
  Filled 2013-06-24: qty 1

## 2013-06-24 NOTE — Progress Notes (Signed)
PPD 1 SVD - PTB at 26 weeks  Patient in NICU since 0900 - will revisit upon return to room for exam   VS: BP 163/95  Pulse 66  Temp(Src) 98.2 F (36.8 C) (Oral)  Resp 16  Ht 5\' 6"  (1.676 m)  Wt 107.502 kg (237 lb)  BMI 38.27 kg/m2  SpO2 98%  LMP 12/19/2012  Breastfeeding? Unknown   LABS:              Recent Labs  06/23/13 1156 06/24/13 0555  WBC 8.5 8.0  HGB 11.4* 10.0*  PLT 242 231               Blood type: --/--/A POS (09/11 1236)  Rubella: Immune (05/23 0000)                     I&O: Intake/Output     09/12 0701 - 09/13 0700 09/13 0701 - 09/14 0700   Blood 300    Total Output 300     Net -300                       Christoper Bushey CNM, MSN, FACNM 06/24/2013, 11:27 AM   Addendum note:  S:  Reports feeling - tired / no PIH symptoms             Tolerating po/ No nausea or vomiting             Bleeding is light             Pain controlled with motrin and some percocet             Up ad lib / ambulatory / voiding QS  Newborn in NICU - been in NICU most of day with baby   O:                          Physical Exam:             Alert and oriented X3  Lungs: Clear and unlabored  Heart: regular rate and rhythm / no mumurs  Abdomen: soft, non-tender, non-distended              Fundus: firm, non-tender, U-3  Perineum: no edema  Lochia: light  Extremities: trace edema, no calf pain or tenderness    A: PPD # 1   Doing well - stable status  P: Routine post partum orders    Marlinda Mike CNM, MSN, [facility]

## 2013-06-24 NOTE — Anesthesia Postprocedure Evaluation (Signed)
  Anesthesia Post-op Note  Patient: Tara Farrell  Procedure(s) Performed: * No procedures listed *  Patient Location: PACU and Women's Unit  Anesthesia Type:Epidural  Level of Consciousness: awake, alert  and oriented  Airway and Oxygen Therapy: Patient Spontanous Breathing  Post-op Pain: mild  Post-op Assessment: Patient's Cardiovascular Status Stable, Respiratory Function Stable, No signs of Nausea or vomiting, Adequate PO intake, Pain level controlled, No headache, No backache, No residual numbness and No residual motor weakness  Post-op Vital Signs: stable  Complications: No apparent anesthesia complications

## 2013-06-25 ENCOUNTER — Encounter (HOSPITAL_COMMUNITY)
Admission: RE | Admit: 2013-06-25 | Discharge: 2013-06-25 | Disposition: A | Discharge status: Home / Self Care | Payer: Managed Care, Other (non HMO) | Source: Ambulatory Visit | Attending: Obstetrics and Gynecology | Admitting: Obstetrics and Gynecology

## 2013-06-25 ENCOUNTER — Ambulatory Visit (HOSPITAL_COMMUNITY): Payer: 59

## 2013-06-25 DIAGNOSIS — O923 Agalactia: Secondary | ICD-10-CM | POA: Insufficient documentation

## 2013-06-25 MED ORDER — IBUPROFEN 600 MG PO TABS: 600.0000 mg | ORAL_TABLET | Freq: Four times a day (QID) | ORAL | Status: DC

## 2013-06-25 MED ORDER — BREAST PUMP MISC: Status: DC

## 2013-06-25 MED ORDER — AZITHROMYCIN 250 MG PO TABS: ORAL_TABLET | ORAL | Status: DC

## 2013-06-25 MED ORDER — GUAIFENESIN 100 MG/5ML PO SOLN: 10.0000 mL | ORAL | Status: DC | PRN

## 2013-06-25 NOTE — Lactation Note (Signed)
Lactation Consultation Note  Patient Name: Tara Farrell [id number] Date: 06/25/2013  Ms. Scoggins being discharged today. DEBP rented and patient will notify insurance company for her personal pump. Recommended that mother use the hospital grade pump for at least one month for maximum stimulation and removal. Advised to assess milk removal equivalency before going to the personal pump. Discussed the benefits of pumping when she is in the NICU, especially after holding baby to better have milk let downs. Due to respiratory status of her 26 week baby, she has not been able to hold skin to skin. Mother is very motivated to provide breast milk for her newborn. Reviewed pumping regime and highly praised for the milk she has been able to pump and provide her baby. LC to follow during infant's hospitalization. Mother encouraged to call for assist or questions. Pump rental  agreement reviewed.   Maternal Data    Feeding    LATCH Score/Interventions                      Lactation Tools Discussed/Used     Consult Status      Omar Person 06/25/2013, 11:30 AM

## 2013-06-25 NOTE — Progress Notes (Signed)
Discharge instructions reviewed with patient and significant other.  Both state understanding of home care, medications, activity, signs/symptoms to report to MD and return MD office visit.  No home equipment needed.  Patient ambulated for discharge in stable condition with staff without incident.

## 2013-06-25 NOTE — Progress Notes (Signed)
PPD 2 SVD  S:  Reports feeling              Some cough - using Robitussin with minimal relief             Tolerating po/ No nausea or vomiting             Bleeding is light             Pain controlled with motrin             Up ad lib / ambulatory / voiding QS  Newborn in NICU - pumping for breast-feeding - colostrum   O:               VS: BP 130/77  Pulse 122  Temp(Src) 99.4 F (37.4 C) (Oral)  Resp 20  Ht 5\' 6"  (1.676 m)  Wt 107.502 kg (237 lb)  BMI 38.27 kg/m2  SpO2 98%  LMP 12/19/2012  Breastfeeding? Unknown                          Physical Exam:             Alert and oriented X3  Lungs: Clear and unlabored  Heart: regular rate and rhythm / no mumurs  Abdomen: soft, non-tender, non-distended              Fundus: firm, non-tender, U-3  Perineum: intact / no edema  Lochia: spotting  Extremities: trace edema, no calf pain or tenderness    A: PPD # 2 PTB at 26 weeks             Non-productive cough for past week   Doing well - stable status  P: Routine post partum orders             Robitussin for cough / saline spray for any congestion  - Azithromycin course (visiting NICU)             DC home      Coordinate FMLA and disability - will return to work at 1-2 weeks with plan to use FMLA when infant discharged from hospital   Marlinda Mike CNM, MSN, [facility] 06/25/2013, 9:13 AM

## 2013-06-25 NOTE — Discharge Summary (Signed)
POSTOPERATIVE DISCHARGE SUMMARY:  Patient ID: Tara Farrell MRN: [id number] DOB/AGE: 01/07/1981 32 y.o.  Admit date: 06/04/2013 Admission Diagnoses: preterm labor at 23 weeks / incompetent cervix - cerclage  Discharge date:  06/25/2013 Discharge Diagnoses: PPD 2 s/p preterm birth at 37 weeks / upper respiratory infection  Prenatal history: G3P0121   EDC : 09/25/2013, by Last Menstrual Period  Prenatal care at [facility]  Primary provider : [place] Prenatal course complicated by incompetent cervix / preterm labor / hx preterm loss at 18 weeks  Prenatal Labs: ABO, Rh: --/--/A POS (09/11 1236)  Antibody: NEG (09/11 1236) Rubella: Immune (05/23 0000)   RPR: NON REACTIVE (09/13 0555)  HBsAg: Negative (05/23 0000)  HIV: Non-reactive (05/23 0000)  GTT : nl GBS: Negative (08/24 0000)   Medical / Surgical History :  Past medical history:  Past Medical History  Diagnosis Date  . Medical history non-contributory   . Preterm labor     18 week SAB    Past surgical history:  Past Surgical History  Procedure Laterality Date  . Dilation and curettage of uterus    . Diagnostic laparoscopy    . Cervical cerclage N/A 03/21/2013    Procedure: CERCLAGE CERVICAL;  Surgeon: Lenoard Aden, MD;  Location: [facility];  Service: Gynecology;  Laterality: N/A;  EDD: 09/25/13    Family History: History reviewed. No pertinent family history.  Social History:  reports that she has never smoked. She has never used smokeless tobacco. She reports that she does not drink alcohol or use illicit drugs.  Allergies: Review of patient's allergies indicates no known allergies.   Current Medications at time of admission:  Prenatal    Hospital Course:  Admit for threatened preterm labor with cervical change despite cerclage BMZ course completed and magnesium sulfate for neuro prophylaxis given  Tocolysis attempted with Procardia and Indocin / bedrest Cerclage removed on 8/24 with  failed tocolysis  Procedures: Preterm vaginal delivery on 9/12 - newborn to NICU  See operative report for further details APGAR (1 MIN): 6   APGAR (5 MINS): 7    Postoperative / postpartum course:  Uncomplicated with discharge on PPD 2 / newborn stable in NICU   Physical Exam:   VSS: Temp:  [97.2 F (36.2 C)-99.4 F (37.4 C)] 99.4 F (37.4 C) (09/14 0615) Pulse Rate:  [122-127] 122 (09/14 0615) Resp:  [18-20] 20 (09/14 0615) BP: (125-147)/(77-89) 130/77 mmHg (09/14 0615) SpO2:  [97 %-99 %] 98 % (09/14 0615)  LABS:  Recent Labs  06/23/13 1156 06/24/13 0555  WBC 8.5 8.0  HGB 11.4* 10.0*  PLT 242 231    General: pleasant / ambulatory / NAD or pain Heart: RRR Lungs: clear  Abdomen: soft and non-tender / non-distended / active BS  Extremities: mild depedent edema / negative Homans   Discharge Instructions:  Discharged Condition: stable  Activity: pelvic rest  Diet: routine  Medications:    Medication List         azithromycin 250 MG tablet  Commonly known as:  ZITHROMAX  I tablet PO daily x 6 days     Breast Pump Misc  Pump every 2-3 hours     guaiFENesin 100 MG/5ML Soln  Commonly known as:  ROBITUSSIN  Take 10 mLs (200 mg total) by mouth every 4 (four) hours as needed.     HYDROcodone-acetaminophen 5-500 MG per tablet  Commonly known as:  VICODIN  Take 1 tablet by mouth every 6 (six) hours as needed for  pain.     ibuprofen 600 MG tablet  Commonly known as:  ADVIL,MOTRIN  Take 1 tablet (600 mg total) by mouth every 6 (six) hours.     magnesium hydroxide 400 MG/5ML suspension  Commonly known as:  MILK OF MAGNESIA  Take 30 mLs by mouth daily as needed for constipation.     prenatal multivitamin Tabs tablet  Take 1 tablet by mouth daily at 12 noon.         Postpartum Instructions: [facility] discharge booklet - instructions reviewed  Discharge to: Home  Follow up :   [facility] in 6 weeks for routine postpartum visit with Dr Billy Coast                 Signed: Marlinda Mike CNM, MSN, [facility] 06/25/2013, 10:06 AM

## 2013-06-26 ENCOUNTER — Ambulatory Visit (HOSPITAL_COMMUNITY): Payer: Managed Care, Other (non HMO)

## 2013-06-26 ENCOUNTER — Encounter (HOSPITAL_COMMUNITY): Payer: Self-pay | Admitting: *Deleted

## 2014-08-13 ENCOUNTER — Encounter (HOSPITAL_COMMUNITY): Payer: Self-pay | Admitting: *Deleted

## 2014-11-18 IMAGING — US US FETAL BPP W/O NONSTRESS
1 series · 12 of 28 positions shown · non-contrast
Comparison: none

[Series 1: us fetal bpp w/o nonstress · 0.23mm/px · 12 of 30 slices shown]
[im 2/30]
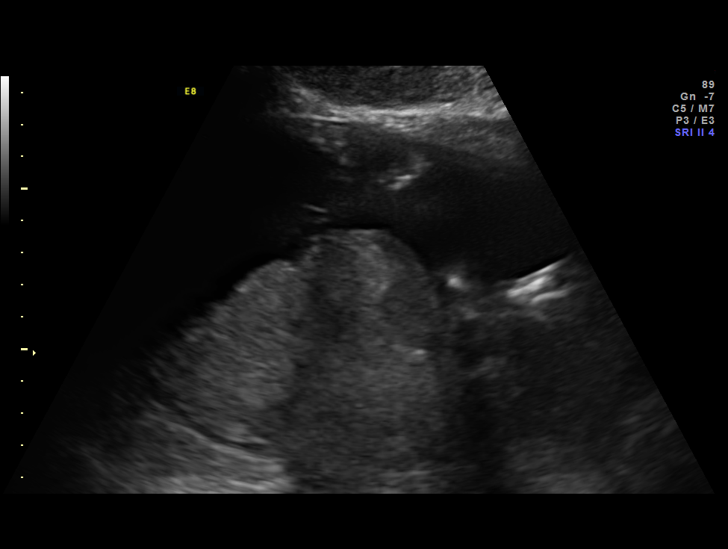
[im 4/30]
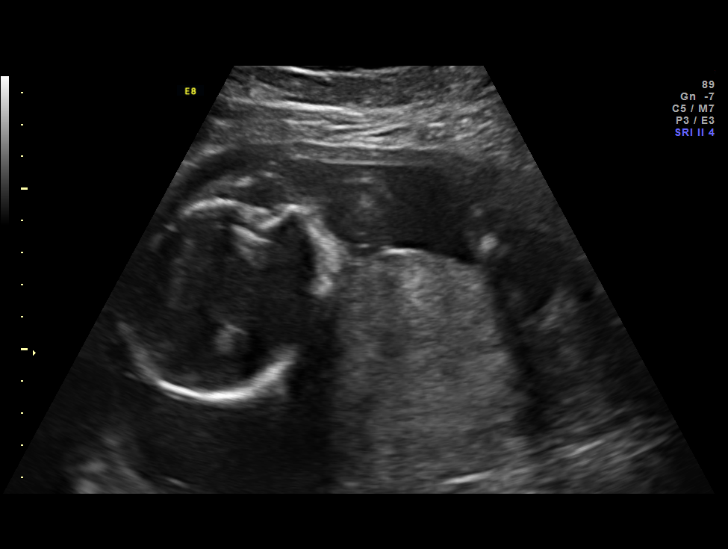
[im 6/30]
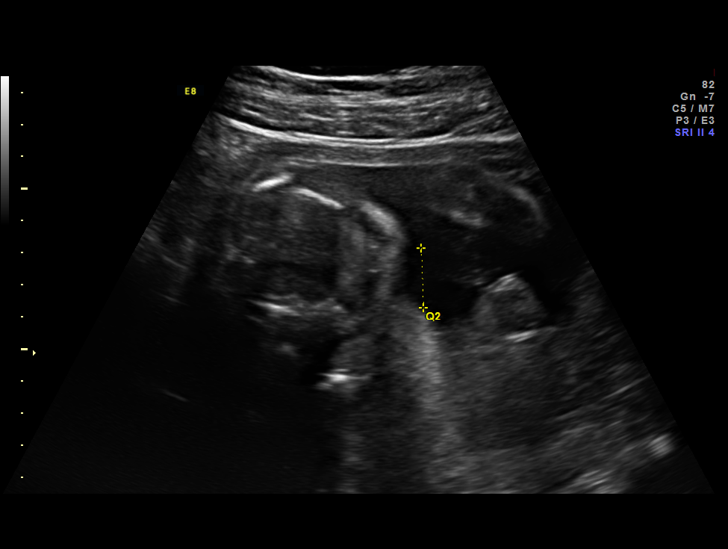
[im 9/30]
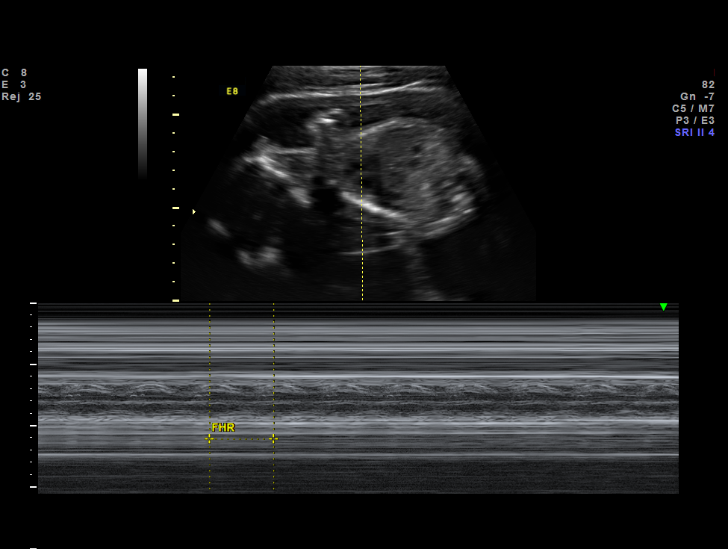
[im 11/30]
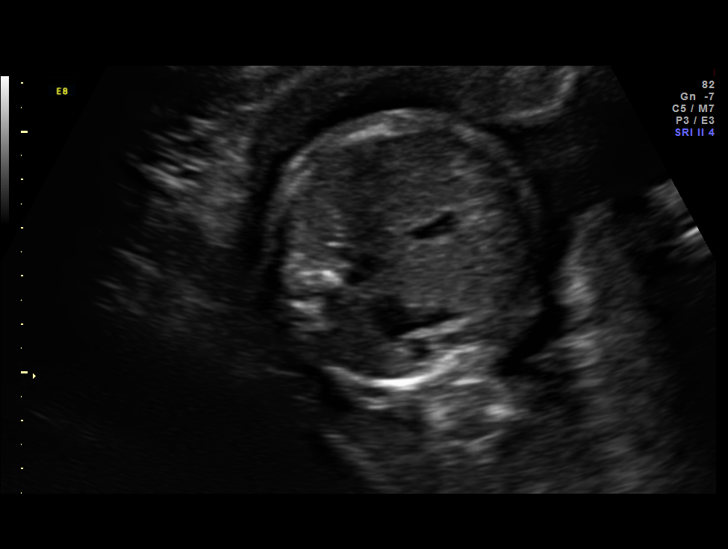
[im 13/30]
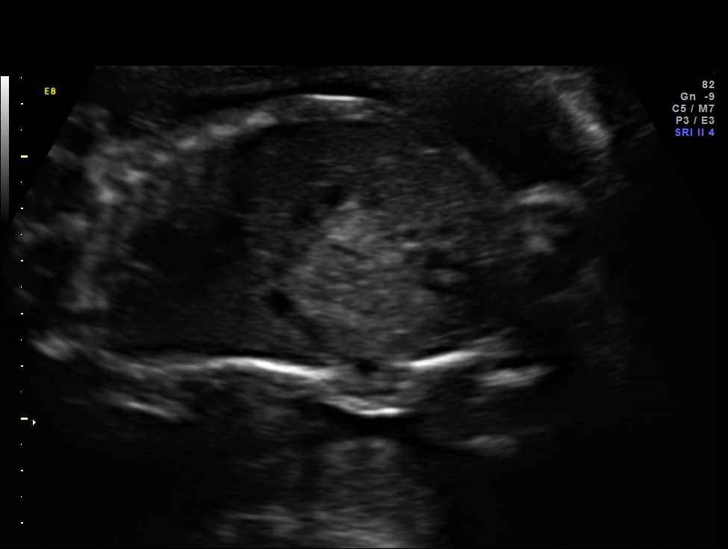
[im 17/30]
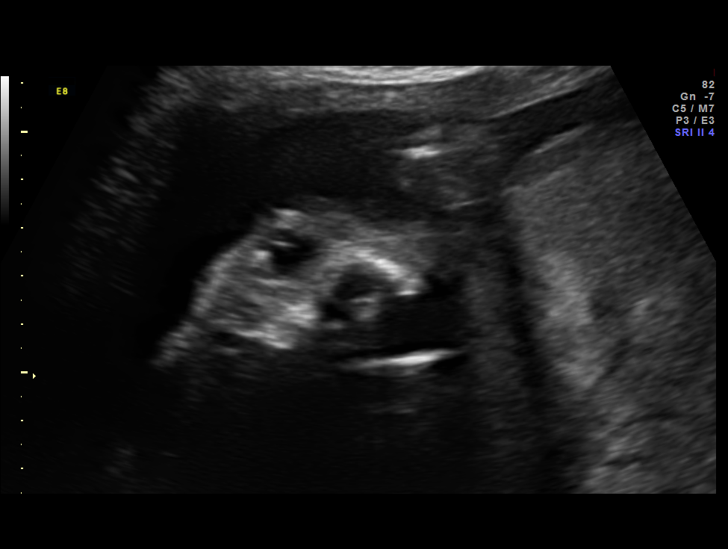
[im 19/30]
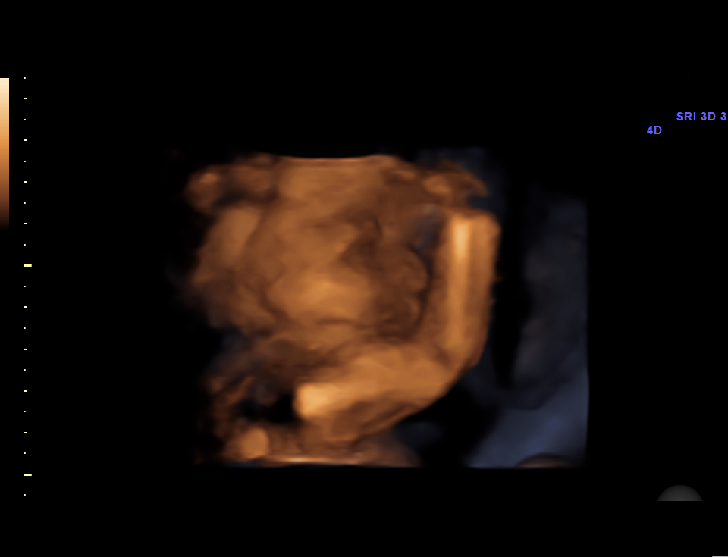
[im 21/30]
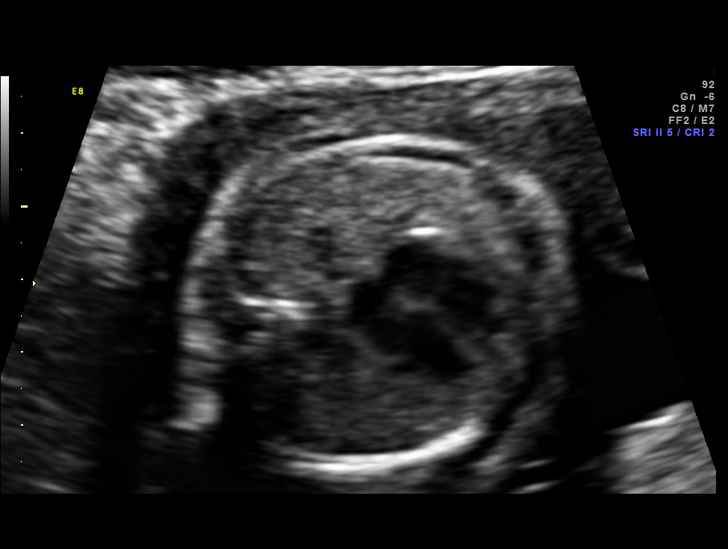
[im 24/30]
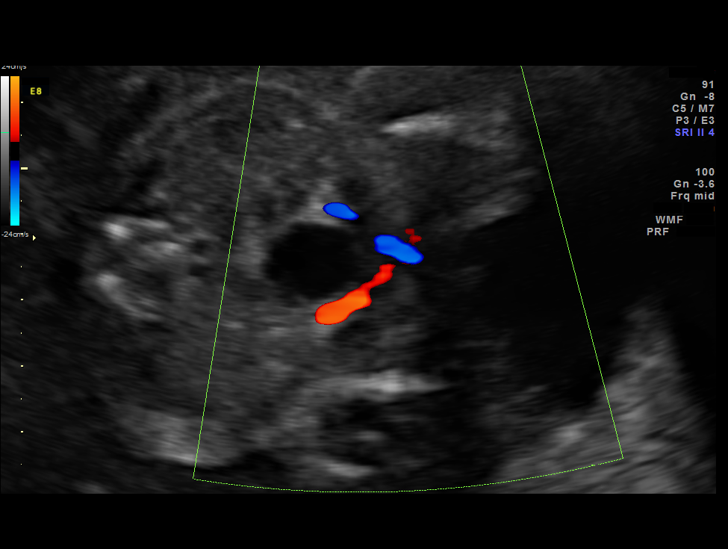
[im 26/30]
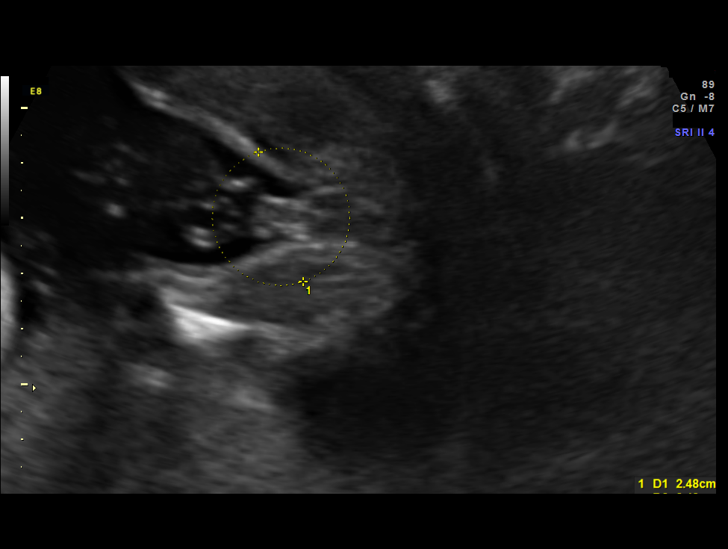
[im 28/30]
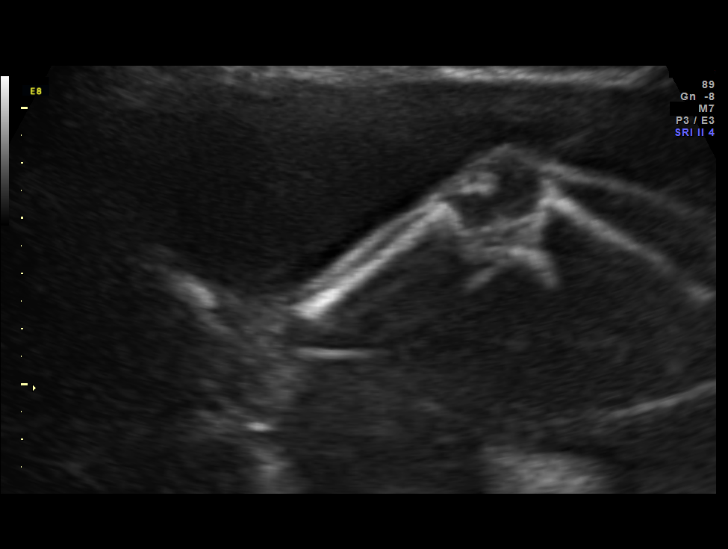

[12 of 28 positions shown; findings below may reference images not displayed]

OBSTETRICS REPORT
                      (Signed Final 06/13/2013 [DATE])

Service(s) Provided

Indications

 Preterm labor (> 22 weeks)
 Cervical insufficiency
Fetal Evaluation

 Num Of Fetuses:    1
 Fetal Heart Rate:  157                          bpm
 Cardiac Activity:  Observed
 Presentation:      Breech
 Placenta:          Posterior, above cervical
                    os
 P. Cord            Previously Visualized
 Insertion:

 Amniotic Fluid
 AFI FV:      Subjectively within normal limits
 AFI Sum:     13.72   cm       41  %Tile     Larg Pckt:     5.6  cm
 RUQ:   5.6     cm   RLQ:    2.56   cm    LUQ:   1.86    cm   LLQ:    3.7    cm
Biophysical Evaluation

 Amniotic F.V:   Within normal limits       F. Tone:        Observed
 F. Movement:    Observed                   Score:          [DATE]
 F. Breathing:   Not Observed
Gestational Age

 LMP:           25w 1d        Date:  12/19/12                 EDD:   09/25/13
 Best:          25w 1d     Det. By:  LMP  (12/19/12)          EDD:   09/25/13
Anatomy

 Cranium:          Previously seen        Aortic Arch:      Not well visualized
 Fetal Cavum:      Previously seen        Ductal Arch:      Not well visualized
 Ventricles:       Previously seen        Diaphragm:        Appears normal
 Choroid Plexus:   Previously seen        Stomach:          Appears normal, left
                                                            sided
 Cerebellum:       Previously seen        Abdomen:          Appears normal
 Posterior Fossa:  Previously seen        Abdominal Wall:   Appears nml (cord
                                                            insert, abd wall)
 Nuchal Fold:      Not applicable (>20    Cord Vessels:     Appears normal (3
                   wks GA)                                  vessel cord)
 Face:             Appears normal         Kidneys:          Appear normal
                   (orbits and profile)
 Lips:             Appears normal         Bladder:          Appears normal
 Heart:            Appears normal         Spine:            Previously seen
                   (4CH, axis, and
                   situs)
 RVOT:             Previously seen        Lower             Previously seen
                                          Extremities:
 LVOT:             Previously seen        Upper             Previously seen
                                          Extremities:

 Other:  Fetus appears to be a male. Heels appears normal.
Cervix Uterus Adnexa

 Cervix:       Not visualized
Impression

 Single IUP at 25 [DATE] weeks
 Cervical insufficiency, s/p cerclage
 Normal fetal growth on [DATE] (27th %tile)
 BPP [DATE] (-2 for lack of sustained fetal breathing activity)
 Normal amniotic fluid volume
Recommendations

 Would coorelate BPP with NST today.
 If tracing is non reactive, recommend follow up BPP
 tomorrow.  If reactive, may follow up BPPs as clinically
 indicated.

 questions or concerns.

## 2014-11-19 IMAGING — US US FETAL BPP W/O NONSTRESS
1 series · 13 of 13 positions shown · non-contrast
Comparison: none

[Series 1: us fetal bpp w/o nonstress · 13 acquisitions, 13 frames shown]
[im 1/13]
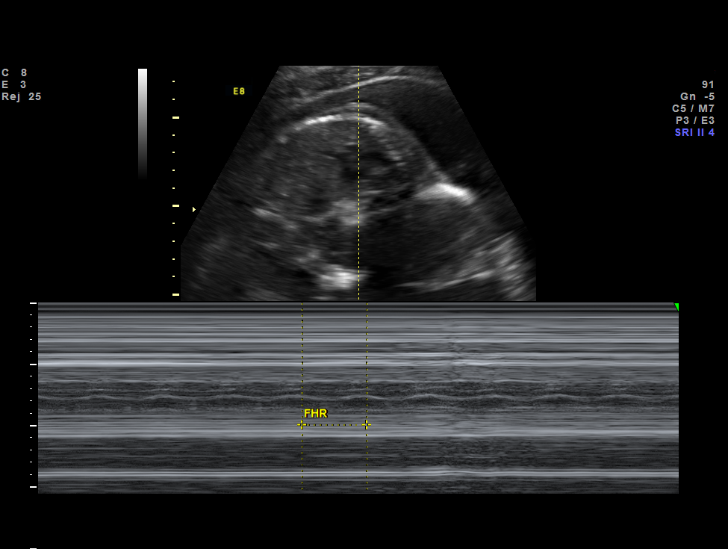
[im 2/13]
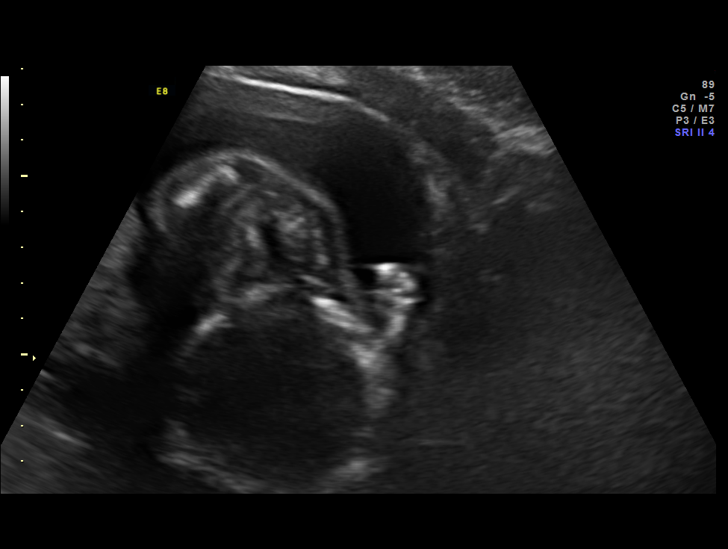
[im 3/13]
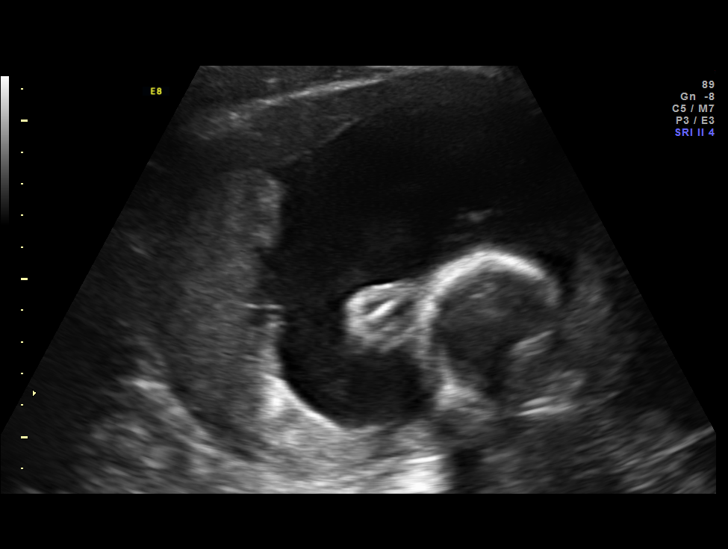
[im 4/13]
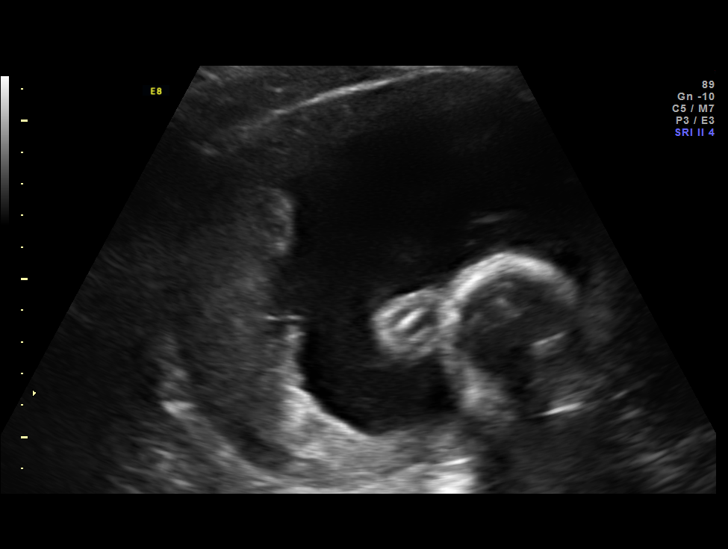
[im 5/13]
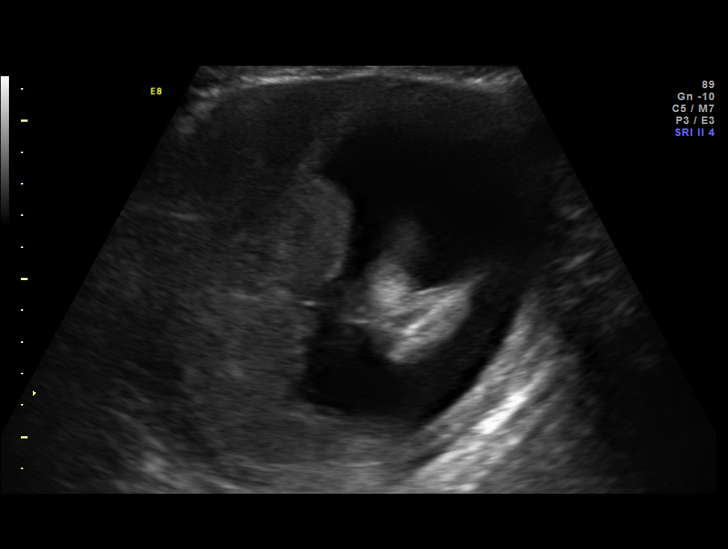
[im 6/13]
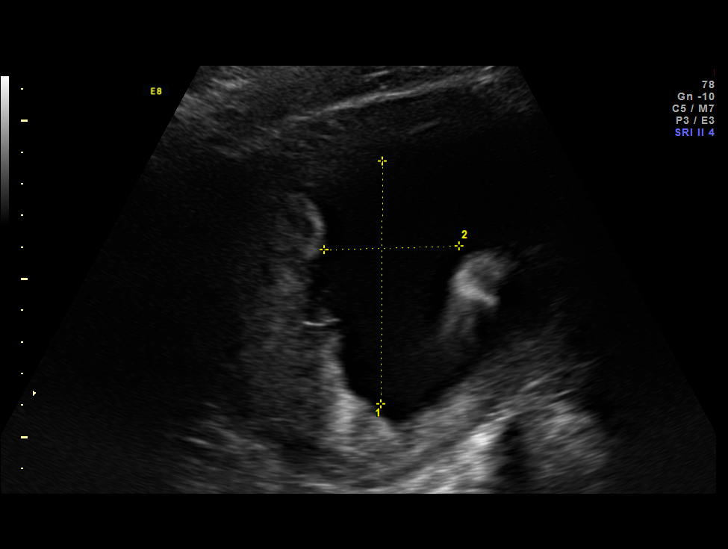
[im 7/13]
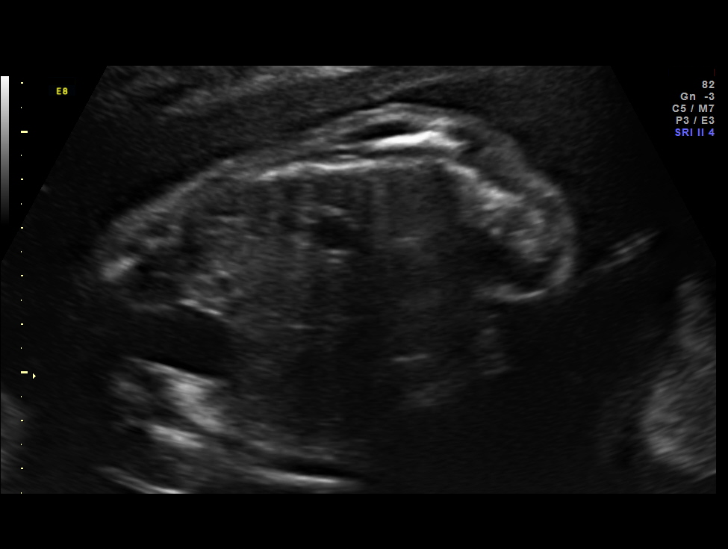
[im 8/13]
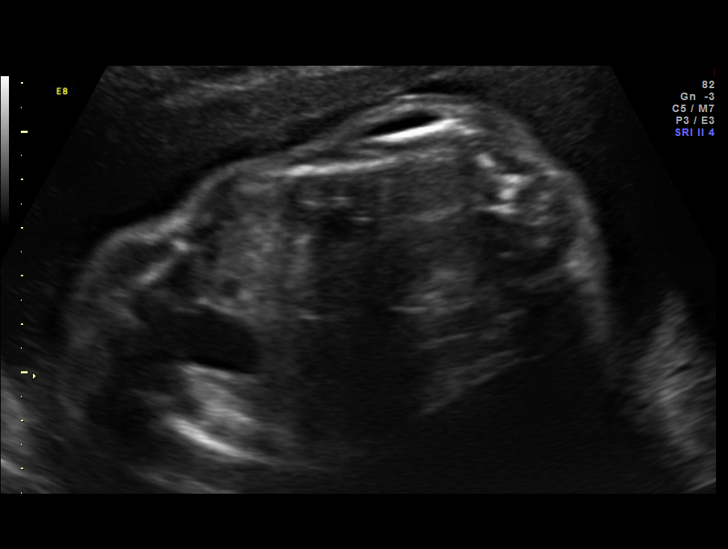
[im 9/13]
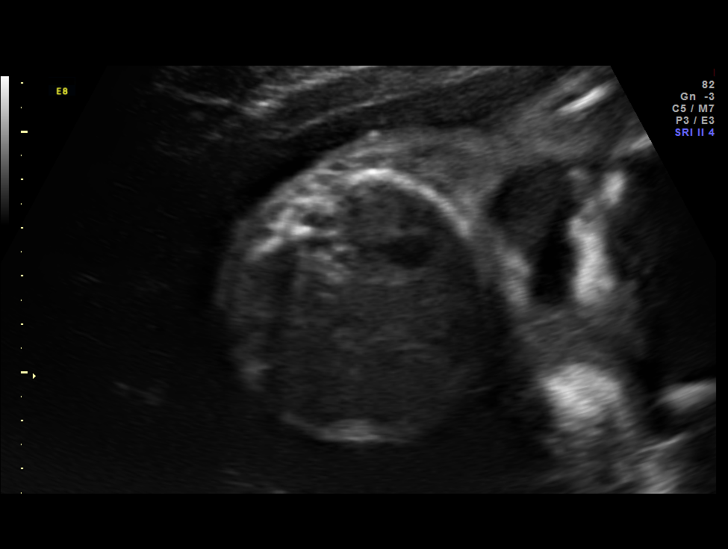
[im 10/13]
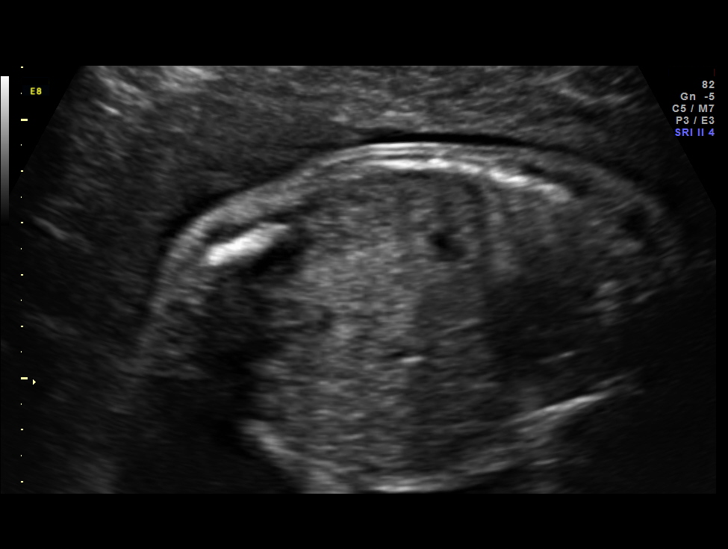
[im 11/13]
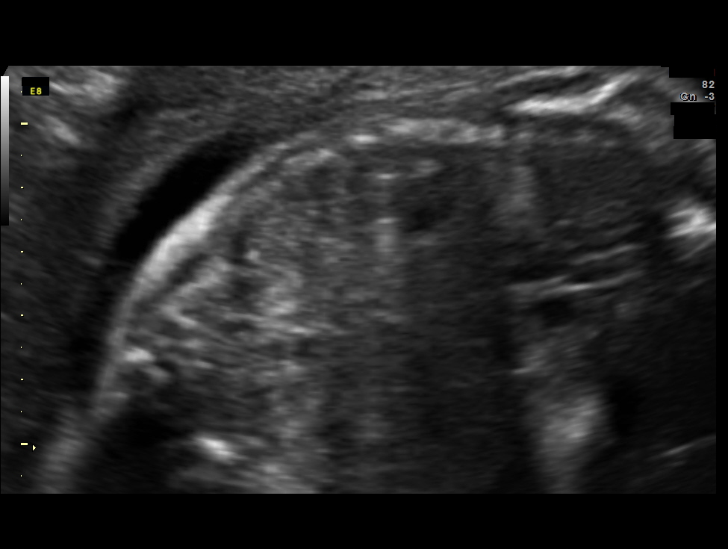
[im 12/13]
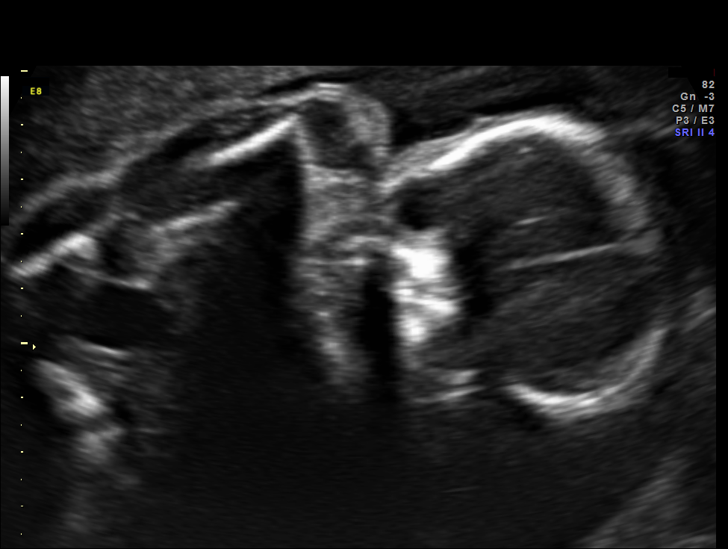
[im 13/13]
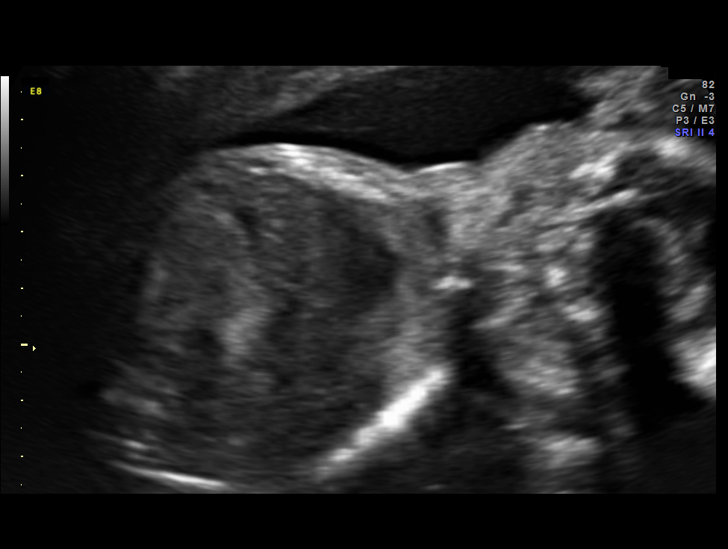

[13 of 13 positions shown; findings below may reference images not displayed]

OBSTETRICS REPORT
                      (Signed Final 06/14/2013 [DATE])

Service(s) Provided

Indications

 Preterm labor (> 22 weeks)
 Cervical insufficiency
Fetal Evaluation

 Num Of Fetuses:    1
 Fetal Heart Rate:  155                          bpm
 Cardiac Activity:  Observed
 Presentation:      Cephalic
 Placenta:          Posterior, above cervical
                    os
 P. Cord            Previously Visualized
 Insertion:

 Amniotic Fluid
 AFI FV:      Subjectively within normal limits
                                             Larg Pckt:     7.7  cm
Biophysical Evaluation

 Amniotic F.V:   Within normal limits       F. Tone:        Observed
 F. Movement:    Observed                   Score:          [DATE]
 F. Breathing:   Not Observed
Gestational Age

 LMP:           25w 2d        Date:  12/19/12                 EDD:   09/25/13
 Best:          25w 2d     Det. By:  LMP  (12/19/12)          EDD:   09/25/13
Cervix Uterus Adnexa

 Cervix:       Not visualized
Impression

 Single IUP at 25 [DATE] weeks
 Cervical insufficiency, s/p cerclage
 Normal fetal growth on [DATE] (27th %tile)
 BPP [DATE] (-2 for absent fetal breathing)
 Normal amniotic fluid volume
Recommendations

 Would coorelate BPP with NST today.
 If tracing is non reactive, recommend follow up BPP
 tomorrow.  If reactive, may follow up BPPs as clinically
 indicated.

 questions or concerns.

## 2014-11-22 IMAGING — US US FETAL BPP W/O NONSTRESS
1 series · 11 of 11 positions shown · non-contrast
Comparison: none

[Series 1: us fetal bpp w/o nonstress · non-contrast · 11 acquisitions, 11 frames shown]
[im 1/11]
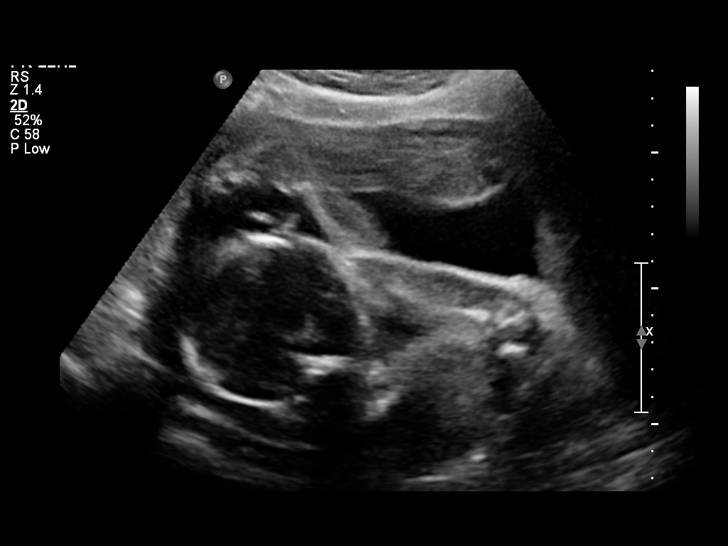
[im 2/11]
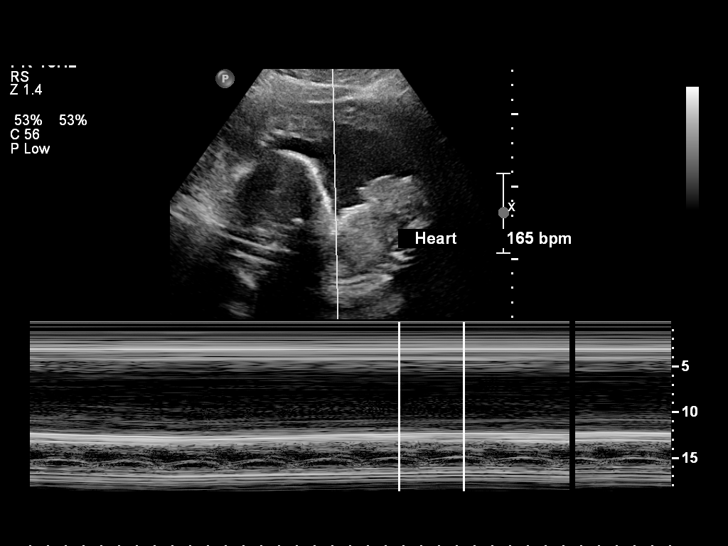
[im 3/11]
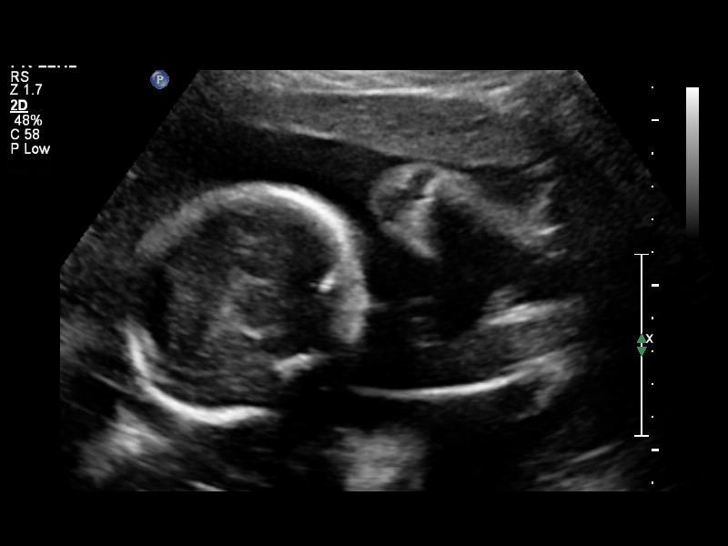
[im 4/11]
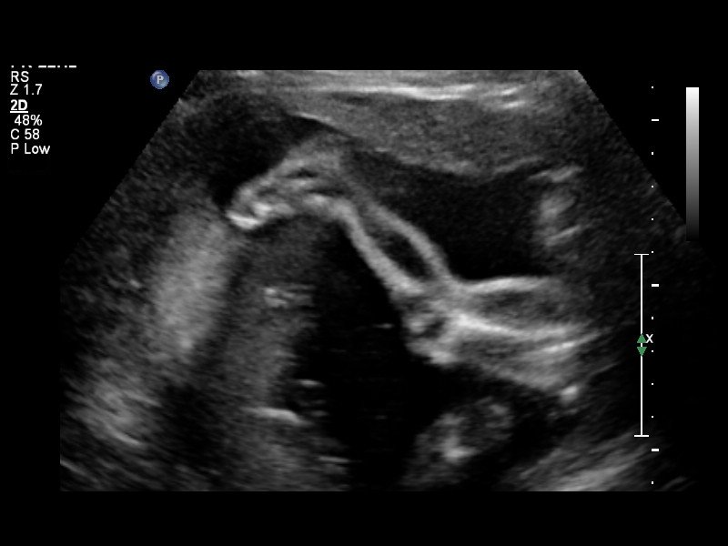
[im 5/11]
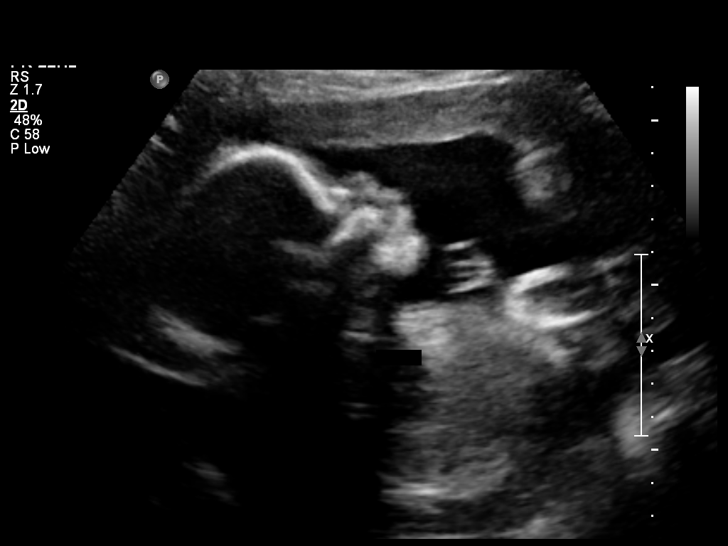
[im 6/11]
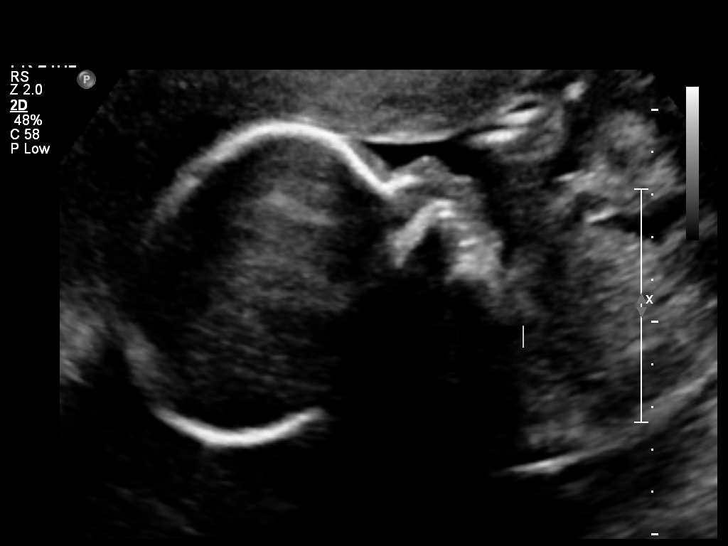
[im 7/11]
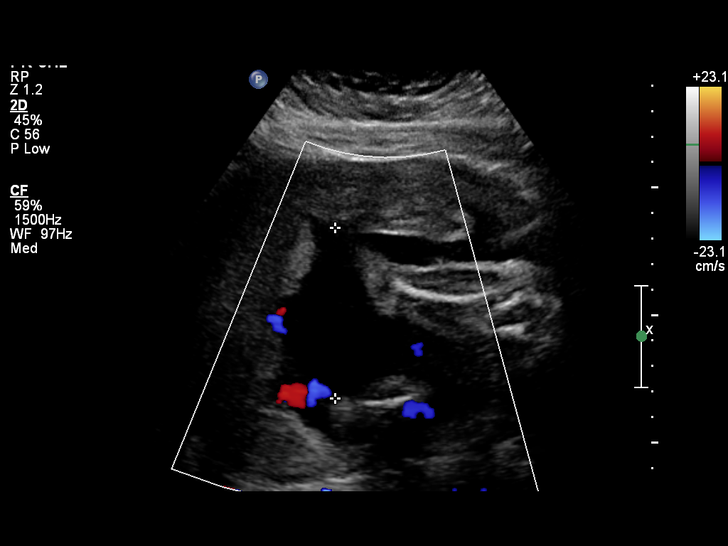
[im 8/11]
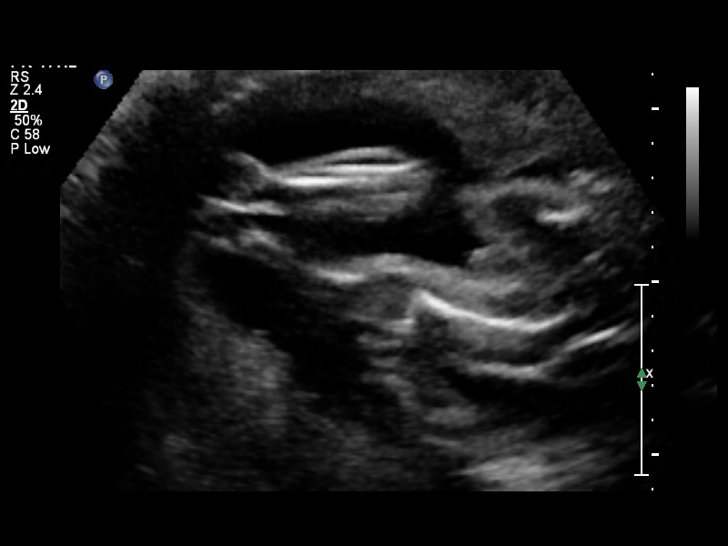
[im 9/11]
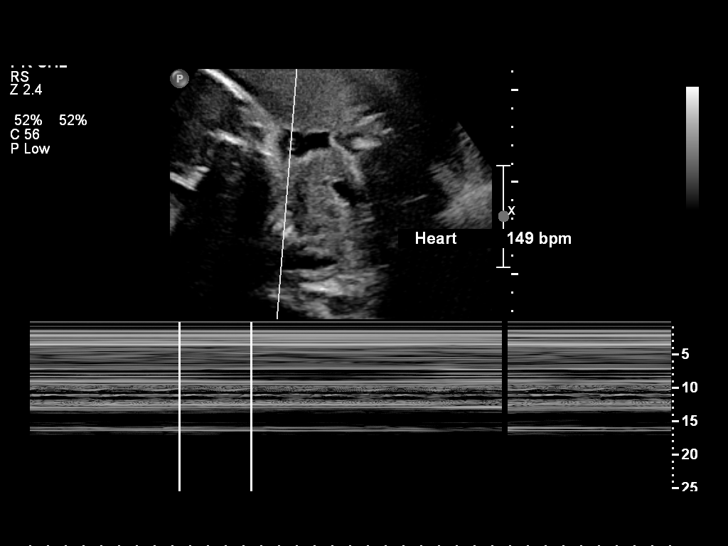
[im 10/11]
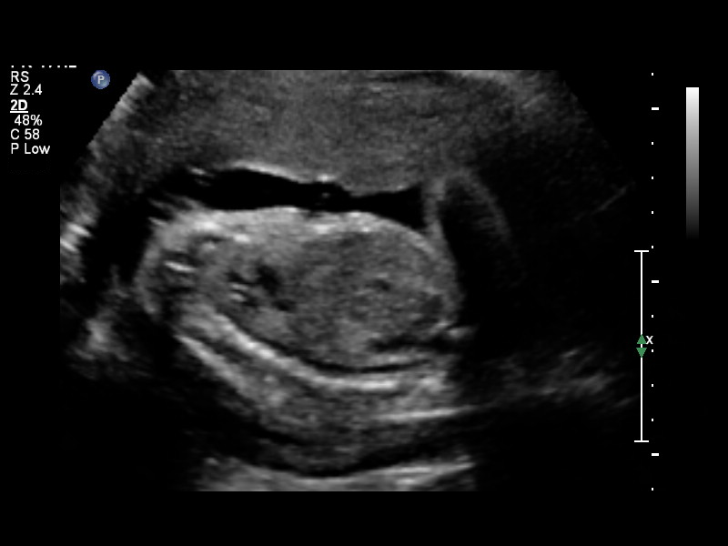
[im 11/11]
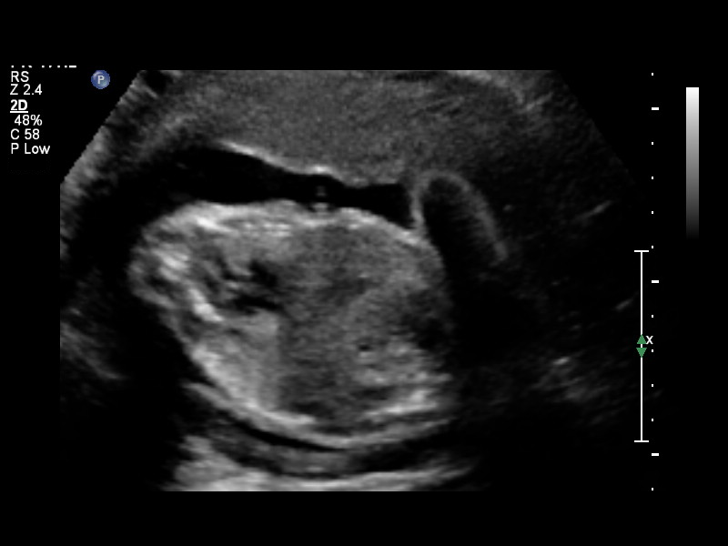

[11 of 11 positions shown; findings below may reference images not displayed]

OBSTETRICS REPORT
                      (Signed Final 06/18/2013 [DATE])

Service(s) Provided

Indications

 Preterm labor
 Cervical insufficiency (status post cerclage)
 Assess fetal well-being
 Uterine fibroids
Fetal Evaluation

 Num Of Fetuses:    1
 Fetal Heart Rate:  165                          bpm
 Cardiac Activity:  Observed
 Presentation:      Transverse, back down,
                    head mat rt
 Placenta:          Posterior, above cervical
                    os
 P. Cord            Previously visualized
 Insertion:

 Amniotic Fluid
 AFI FV:      Subjectively within normal limits
                                             Larg Pckt:     6.7  cm
Biophysical Evaluation

 Amniotic F.V:   Pocket => 2 cm two         F. Tone:         Observed
                 planes
 F. Movement:    Observed                   Score:           [DATE]
 F. Breathing:   Observed
Gestational Age

 LMP:           25w 5d        Date:  12/19/12                 EDD:   09/25/13
 Best:          25w 5d     Det. By:  LMP  (12/19/12)          EDD:   09/25/13
Comments

 Study limited today to BPP.
Impression

 Single IUP at 25 [DATE] weeks
 Normal amniotic fluid volume
 BPP [DATE]
 Appropriate fetal growth on [DATE] (27th %tile)
Recommendations

 Follow up evaluation of BPP recommended for non reactive
 fetal heart rate tracing.  If patient remains undelivered,
 recommend repeat evaluation of fetal growth in 1 week.

 questions or concerns.
                Ajanel, Subestacion

## 2021-08-22 ENCOUNTER — Other Ambulatory Visit: Payer: Self-pay

## 2021-08-22 ENCOUNTER — Encounter: Payer: Self-pay | Admitting: Pulmonary Disease

## 2021-08-22 ENCOUNTER — Ambulatory Visit: Payer: No Typology Code available for payment source

## 2021-08-22 ENCOUNTER — Ambulatory Visit: Payer: No Typology Code available for payment source | Admitting: Pulmonary Disease

## 2021-08-22 VITALS — BP 128/82 | HR 92 | Ht 66.0 in | Wt 270.4 lb

## 2021-08-22 DIAGNOSIS — J4530 Mild persistent asthma, uncomplicated: Secondary | ICD-10-CM

## 2021-08-22 DIAGNOSIS — R0981 Nasal congestion: Secondary | ICD-10-CM

## 2021-08-22 MED ORDER — PREDNISONE 10 MG PO TABS: ORAL_TABLET | ORAL | 0 refills | Status: AC

## 2021-08-22 MED ORDER — MONTELUKAST SODIUM 10 MG PO TABS: 10.0000 mg | ORAL_TABLET | Freq: Every day | ORAL | 11 refills | Status: DC

## 2021-08-22 MED ORDER — AZITHROMYCIN 250 MG PO TABS: ORAL_TABLET | ORAL | 0 refills | Status: DC

## 2021-08-22 NOTE — Progress Notes (Signed)
Synopsis: Referred in November 2022 for shortness of breath, by Merri Brunette, MD  Subjective:   PATIENT ID: Tara Farrell GENDER: female DOB: 1981/01/05, MRN: [id number]   HPI  Chief Complaint  Patient presents with   Consult    Referred by PCP for chronic cough and SOB since 2021. Was told that the cough was coming from GERD. Productive cough with thick white phlegm. Cough and SOB tends to get worse with heat.    Tara Farrell is a 40 year old woman, never smoker who is referred to pulmonary clinic for chronic cough.   She reports having cough since 2021 with associated posttussive emesis episodes.  She has noticed increasing dyspnea with exertion and also wheezing.  She has been treated for GERD with famotidine with some improvement in the cough since last December.  She does complain of seasonal allergies year-round which is new for her.  She has lived in [place] her entire life and is not new to the [place] area.  She denies sinus congestion or drainage but does sound nasally on interview.  She was recently started on Symbicort 80-4 0.5 MCG 2 puffs twice daily and has noticed some improvement in the cough.  She also noticed dry skin of her hands and feet that is peeling since starting the Symbicort inhaler.  She denies any blistering or bumps prior to the peeling of the skin.  Otherwise no skin rashes or joint aches.  No chest imaging on record.  No family history of asthma or lung conditions.  Past Medical History:  Diagnosis Date   Medical history non-contributory    Preterm labor    18 week SAB     No family history on file.   Social History   Socioeconomic History   Marital status: Single    Spouse name: Not on file   Number of children: Not on file   Years of education: Not on file   Highest education level: Not on file  Occupational History   Not on file  Tobacco Use   Smoking status: Never   Smokeless tobacco: Never  Substance and Sexual Activity    Alcohol use: No   Drug use: No   Sexual activity: Not Currently    Birth control/protection: None  Other Topics Concern   Not on file  Social History Narrative   Not on file   Social Determinants of Health   Financial Resource Strain: Not on file  Food Insecurity: Not on file  Transportation Needs: Not on file  Physical Activity: Not on file  Stress: Not on file  Social Connections: Not on file  Intimate Partner Violence: Not on file     No Known Allergies   Outpatient Medications Prior to Visit  Medication Sig Dispense Refill   famotidine (PEPCID) 40 MG tablet Take 40 mg by mouth daily.     omeprazole (PRILOSEC) 20 MG capsule Take 20 mg by mouth daily.     SYMBICORT 80-4.5 MCG/ACT inhaler Inhale 2 puffs into the lungs in the morning and at bedtime.     azithromycin (ZITHROMAX) 250 MG tablet I tablet PO daily x 6 days 6 each 0   guaiFENesin (ROBITUSSIN) 100 MG/5ML SOLN Take 10 mLs (200 mg total) by mouth every 4 (four) hours as needed. 1200 mL 0   HYDROcodone-acetaminophen (VICODIN) 5-500 MG per tablet Take 1 tablet by mouth every 6 (six) hours as needed for pain.     ibuprofen (ADVIL,MOTRIN) 600 MG tablet Take 1 tablet (  600 mg total) by mouth every 6 (six) hours. 30 tablet 0   magnesium hydroxide (MILK OF MAGNESIA) 400 MG/5ML suspension Take 30 mLs by mouth daily as needed for constipation.     Misc. Devices (BREAST PUMP) MISC Pump every 2-3 hours 1 each 0   Prenatal Vit-Fe Fumarate-FA (PRENATAL MULTIVITAMIN) TABS Take 1 tablet by mouth daily at 12 noon.     No facility-administered medications prior to visit.   Review of Systems  Constitutional:  Negative for chills, fever, malaise/fatigue and weight loss.  HENT:  Negative for congestion, sinus pain and sore throat.   Eyes: Negative.   Respiratory:  Positive for cough, sputum production, shortness of breath and wheezing. Negative for hemoptysis.   Cardiovascular:  Negative for chest pain, palpitations, orthopnea,  claudication and leg swelling.  Gastrointestinal:  Positive for heartburn. Negative for abdominal pain, nausea and vomiting.  Genitourinary: Negative.   Musculoskeletal:  Negative for joint pain and myalgias.  Skin:  Negative for rash.  Neurological:  Negative for weakness.  Endo/Heme/Allergies:  Positive for environmental allergies.  Psychiatric/Behavioral: Negative.     Objective:   Vitals:   08/22/21 1629  BP: 128/82  Pulse: 92  SpO2: 99%  Weight: 270 lb 6.4 oz (122.7 kg)  Height: 5\' 6"  (1.676 m)     Physical Exam Constitutional:      General: She is not in acute distress.    Appearance: She is obese. She is not ill-appearing.  HENT:     Head: Normocephalic and atraumatic.  Eyes:     General: No scleral icterus.    Conjunctiva/sclera: Conjunctivae normal.     Pupils: Pupils are equal, round, and reactive to light.  Cardiovascular:     Rate and Rhythm: Normal rate and regular rhythm.     Pulses: Normal pulses.     Heart sounds: Normal heart sounds. No murmur heard. Pulmonary:     Effort: Pulmonary effort is normal.     Breath sounds: Normal breath sounds. No wheezing, rhonchi or rales.  Abdominal:     General: Bowel sounds are normal.     Palpations: Abdomen is soft.  Musculoskeletal:     Right lower leg: No edema.     Left lower leg: No edema.  Lymphadenopathy:     Cervical: No cervical adenopathy.  Skin:    General: Skin is warm and dry.  Neurological:     General: No focal deficit present.     Mental Status: She is alert.  Psychiatric:        Mood and Affect: Mood normal.        Behavior: Behavior normal.        Thought Content: Thought content normal.        Judgment: Judgment normal.    CBC    Component Value Date/Time   WBC 8.0 06/24/2013 0555   RBC 4.41 06/24/2013 0555   HGB 10.0 (L) 06/24/2013 0555   HCT 30.4 (L) 06/24/2013 0555   PLT 231 06/24/2013 0555   MCV 68.9 (L) 06/24/2013 0555   MCH 22.7 (L) 06/24/2013 0555   MCHC 32.9 06/24/2013  0555   RDW 15.4 06/24/2013 0555   LYMPHSABS 1.6 06/04/2013 0551   MONOABS 0.6 06/04/2013 0551   EOSABS 0.0 06/04/2013 0551   BASOSABS 0.0 06/04/2013 0551   Chest imaging:  PFT: No flowsheet data found.  Labs:  Path:  Echo:  Heart Catheterization:  Assessment & Plan:   Mild persistent asthma without complication - Plan: predniSONE (DELTASONE)  10 MG tablet, azithromycin (ZITHROMAX) 250 MG tablet, montelukast (SINGULAIR) 10 MG tablet, DG Chest 2 View, Pulmonary Function Test  Sinus congestion - Plan: predniSONE (DELTASONE) 10 MG tablet, azithromycin (ZITHROMAX) 250 MG tablet  Discussion: Tara Farrell is a 40 year old woman, never smoker who is referred to pulmonary clinic for chronic cough.   Her symptoms appear to be consistent with mild persistent asthma or reactive airways disease.  She has noted some improvement since recently starting ICS/LABA inhaler therapy.  We will provide her with a prednisone taper and Z-Pak and monitor for improvement in her symptoms.  She is to continue the Symbicort 80-4 0.5 MCG 2 puffs twice daily.  I would also like her to use fluticasone nasal spray 1 spray per nostril daily for possible sinus congestion and postnasal drainage.  She is to start montelukast 10 mg daily for allergies.  She is to continue on famotidine for acid reflux control.  We will check a chest x-ray today or in the coming week at her convenience.  We will have her follow-up in 1 month and check pulmonary function tests at that time.  Tara Comas, MD [facility] Office: [phone]    Current Outpatient Medications:    azithromycin (ZITHROMAX) 250 MG tablet, Take as directed, Disp: 6 tablet, Rfl: 0   famotidine (PEPCID) 40 MG tablet, Take 40 mg by mouth daily., Disp: , Rfl:    montelukast (SINGULAIR) 10 MG tablet, Take 1 tablet (10 mg total) by mouth at bedtime., Disp: 30 tablet, Rfl: 11   omeprazole (PRILOSEC) 20 MG capsule, Take 20 mg by mouth  daily., Disp: , Rfl:    predniSONE (DELTASONE) 10 MG tablet, Take 4 tablets (40 mg total) by mouth daily with breakfast for 3 days, THEN 3 tablets (30 mg total) daily with breakfast for 3 days, THEN 2 tablets (20 mg total) daily with breakfast for 3 days, THEN 1 tablet (10 mg total) daily with breakfast for 3 days., Disp: 30 tablet, Rfl: 0   SYMBICORT 80-4.5 MCG/ACT inhaler, Inhale 2 puffs into the lungs in the morning and at bedtime., Disp: , Rfl:

## 2021-08-22 NOTE — Patient Instructions (Addendum)
Prednisone taper: 40mg  x 3 days 30mg  x 3 days 20mg  x 3 days 10mg  x 3 days  Start Z-pak antibiotic  Continue symbicort inhaler 2 puffs twice daily  Continue famotidine for acid reflux  Start montelukast 10mg  daily for allergies  We will check a chest x-ray in the coming week  We will check pulmonary function tests at your 1 month follow up

## 2021-09-09 ENCOUNTER — Ambulatory Visit (INDEPENDENT_AMBULATORY_CARE_PROVIDER_SITE_OTHER): Payer: No Typology Code available for payment source

## 2021-09-09 DIAGNOSIS — J4530 Mild persistent asthma, uncomplicated: Secondary | ICD-10-CM | POA: Diagnosis not present

## 2021-10-08 ENCOUNTER — Ambulatory Visit: Payer: No Typology Code available for payment source

## 2021-10-08 ENCOUNTER — Other Ambulatory Visit: Payer: Self-pay

## 2021-10-08 ENCOUNTER — Encounter: Payer: Self-pay | Admitting: Pulmonary Disease

## 2021-10-08 ENCOUNTER — Ambulatory Visit (INDEPENDENT_AMBULATORY_CARE_PROVIDER_SITE_OTHER): Payer: No Typology Code available for payment source | Admitting: Pulmonary Disease

## 2021-10-08 VITALS — BP 128/86 | HR 89 | Ht 66.0 in | Wt 268.4 lb

## 2021-10-08 DIAGNOSIS — J4530 Mild persistent asthma, uncomplicated: Secondary | ICD-10-CM | POA: Diagnosis not present

## 2021-10-08 MED ORDER — ALBUTEROL SULFATE HFA 108 (90 BASE) MCG/ACT IN AERS: 2.0000 | INHALATION_SPRAY | Freq: Four times a day (QID) | RESPIRATORY_TRACT | 6 refills | Status: DC | PRN

## 2021-10-08 NOTE — Patient Instructions (Addendum)
We will check with our pharmacy team to see if there is a cheaper inhaler option.   We will give you a spacer to use with your current Symbicort inhaler - make sure to rinse your mouth and gargle water after each use to avoid throat discomfort  Continue to use fluticasone nasal spray as needed  We will schedule you for pulmonary function tests at the next follow up visit. Please give [place] a copy of your covid vaccine card.

## 2021-10-08 NOTE — Progress Notes (Signed)
Synopsis: Referred in November 2022 for shortness of breath, by Merri Brunette, MD  Subjective:   PATIENT ID: [name]: female DOB: 1981/08/27, MRN: [id number]   HPI  Chief Complaint  Patient presents with   Follow-up    1 mo f/u for asthma. Still using Symbicort . Not able to complete PFT today.    Hero Mccathern is a 40 year old woman, never smoker who returns to pulmonary clinic for follow up of asthma.   She was treated with prednisone and azithromycin at last visit for sinus infection with improvement in her cough and sinus congestion. She ran out of symbicort inhaler and her cough returns but since receiving her prescription he cough is improved but remains. Her symbicort costs $100 per month.  She denies night time awakenings due to cough or wheezing.   She is using fluticasone nasal spray PRN for nasal congestion. She reports headache and night terrors with montelukast and is not taking this medication.   Unable to get PFTs done today due to confusion of scheduling and evidence of covid vaccines.   OV 08/22/21 She reports having cough since 2021 with associated posttussive emesis episodes.  She has noticed increasing dyspnea with exertion and also wheezing.  She has been treated for GERD with famotidine with some improvement in the cough since last December.  She does complain of seasonal allergies year-round which is new for her.  She has lived in [place] her entire life and is not new to the [place] area.  She denies sinus congestion or drainage but does sound nasally on interview.  She was recently started on Symbicort 80-4 0.5 MCG 2 puffs twice daily and has noticed some improvement in the cough.  She also noticed dry skin of her hands and feet that is peeling since starting the Symbicort inhaler.  She denies any blistering or bumps prior to the peeling of the skin.  Otherwise no skin rashes or joint aches.  No chest imaging on record.  No family  history of asthma or lung conditions.  Past Medical History:  Diagnosis Date   Medical history non-contributory    Preterm labor    18 week SAB     No family history on file.   Social History   Socioeconomic History   Marital status: Single    Spouse name: Not on file   Number of children: Not on file   Years of education: Not on file   Highest education level: Not on file  Occupational History   Not on file  Tobacco Use   Smoking status: Never   Smokeless tobacco: Never  Substance and Sexual Activity   Alcohol use: No   Drug use: No   Sexual activity: Not Currently    Birth control/protection: None  Other Topics Concern   Not on file  Social History Narrative   Not on file   Social Determinants of Health   Financial Resource Strain: Not on file  Food Insecurity: Not on file  Transportation Needs: Not on file  Physical Activity: Not on file  Stress: Not on file  Social Connections: Not on file  Intimate Partner Violence: Not on file     No Known Allergies   Outpatient Medications Prior to Visit  Medication Sig Dispense Refill   famotidine (PEPCID) 40 MG tablet Take 40 mg by mouth daily.     montelukast (SINGULAIR) 10 MG tablet Take 1 tablet (10 mg total) by mouth at bedtime. 30 tablet 11  omeprazole (PRILOSEC) 20 MG capsule Take 20 mg by mouth daily.     SYMBICORT 80-4.5 MCG/ACT inhaler Inhale 2 puffs into the lungs in the morning and at bedtime.     azithromycin (ZITHROMAX) 250 MG tablet Take as directed 6 tablet 0   No facility-administered medications prior to visit.   Review of Systems  Constitutional:  Negative for chills, fever, malaise/fatigue and weight loss.  HENT:  Negative for congestion, sinus pain and sore throat.   Eyes: Negative.   Respiratory:  Positive for cough and shortness of breath. Negative for hemoptysis, sputum production and wheezing.   Cardiovascular:  Negative for chest pain, palpitations, orthopnea, claudication and leg  swelling.  Gastrointestinal:  Positive for heartburn. Negative for abdominal pain, nausea and vomiting.  Genitourinary: Negative.   Musculoskeletal:  Negative for joint pain and myalgias.  Skin:  Negative for rash.  Neurological:  Negative for weakness.  Endo/Heme/Allergies:  Positive for environmental allergies.  Psychiatric/Behavioral: Negative.     Objective:   Vitals:   10/08/21 0955  BP: 128/86  Pulse: 89  SpO2: 100%  Weight: 268 lb 6.4 oz (121.7 kg)  Height: 5\' 6"  (1.676 m)    Physical Exam Constitutional:      General: She is not in acute distress.    Appearance: She is obese. She is not ill-appearing.  HENT:     Head: Normocephalic and atraumatic.  Eyes:     General: No scleral icterus.    Conjunctiva/sclera: Conjunctivae normal.  Cardiovascular:     Rate and Rhythm: Normal rate and regular rhythm.     Pulses: Normal pulses.     Heart sounds: Normal heart sounds. No murmur heard. Pulmonary:     Effort: Pulmonary effort is normal.     Breath sounds: Normal breath sounds. No wheezing, rhonchi or rales.  Musculoskeletal:     Right lower leg: No edema.     Left lower leg: No edema.  Skin:    General: Skin is warm and dry.  Neurological:     General: No focal deficit present.     Mental Status: She is alert.  Psychiatric:        Mood and Affect: Mood normal.        Behavior: Behavior normal.        Thought Content: Thought content normal.        Judgment: Judgment normal.    CBC    Component Value Date/Time   WBC 8.0 06/24/2013 0555   RBC 4.41 06/24/2013 0555   HGB 10.0 (L) 06/24/2013 0555   HCT 30.4 (L) 06/24/2013 0555   PLT 231 06/24/2013 0555   MCV 68.9 (L) 06/24/2013 0555   MCH 22.7 (L) 06/24/2013 0555   MCHC 32.9 06/24/2013 0555   RDW 15.4 06/24/2013 0555   LYMPHSABS 1.6 06/04/2013 0551   MONOABS 0.6 06/04/2013 0551   EOSABS 0.0 06/04/2013 0551   BASOSABS 0.0 06/04/2013 0551   Chest imaging: CXR 09/09/21 Heart size within normal limits. No  appreciable airspace consolidation. No evidence of pleural effusion or pneumothorax. No acute bony abnormality identified.  PFT: No flowsheet data found.  Labs:  Path:  Echo:  Heart Catheterization:  Assessment & Plan:   Mild persistent asthma without complication - Plan: albuterol (VENTOLIN HFA) 108 (90 Base) MCG/ACT inhaler  Discussion: Shellee Streng is a 40 year old woman, never smoker who returns to pulmonary clinic for follow up of asthma.   She is to continue symbicort 80-4.9mcg 2 puffs twice daily.  We will provide her with spacer and monitor for improvement of her throat irritation. She can use albuterol inhaler as needed.   We will check with our pharmacy team about the cost of other ICS/LABA inhalers given her symbicort is $100 per month.   Follow up in 3 months with pulmonary function tests.  Melody Comas, MD [facility] Office: [phone]    Current Outpatient Medications:    albuterol (VENTOLIN HFA) 108 (90 Base) MCG/ACT inhaler, Inhale 2 puffs into the lungs every 6 (six) hours as needed for wheezing or shortness of breath., Disp: 8 g, Rfl: 6   famotidine (PEPCID) 40 MG tablet, Take 40 mg by mouth daily., Disp: , Rfl:    montelukast (SINGULAIR) 10 MG tablet, Take 1 tablet (10 mg total) by mouth at bedtime., Disp: 30 tablet, Rfl: 11   omeprazole (PRILOSEC) 20 MG capsule, Take 20 mg by mouth daily., Disp: , Rfl:    SYMBICORT 80-4.5 MCG/ACT inhaler, Inhale 2 puffs into the lungs in the morning and at bedtime., Disp: , Rfl:

## 2021-10-15 ENCOUNTER — Other Ambulatory Visit: Payer: Self-pay | Admitting: *Deleted

## 2021-10-15 DIAGNOSIS — J4530 Mild persistent asthma, uncomplicated: Secondary | ICD-10-CM

## 2021-10-15 MED ORDER — ALBUTEROL SULFATE HFA 108 (90 BASE) MCG/ACT IN AERS: 1.0000 | INHALATION_SPRAY | Freq: Four times a day (QID) | RESPIRATORY_TRACT | 3 refills | Status: DC | PRN

## 2021-10-23 ENCOUNTER — Other Ambulatory Visit: Payer: Self-pay | Admitting: *Deleted

## 2021-10-23 MED ORDER — ALBUTEROL SULFATE HFA 108 (90 BASE) MCG/ACT IN AERS: 1.0000 | INHALATION_SPRAY | Freq: Four times a day (QID) | RESPIRATORY_TRACT | 3 refills | Status: DC | PRN

## 2021-11-03 ENCOUNTER — Telehealth: Payer: Self-pay | Admitting: Pulmonary Disease

## 2021-11-03 MED ORDER — PREDNISONE 10 MG PO TABS: ORAL_TABLET | ORAL | 0 refills | Status: DC

## 2021-11-03 NOTE — Telephone Encounter (Signed)
Primary Pulmonologist: Dewald Last office visit and with whom: 10/08/21 with JD What do we see them for (pulmonary problems): asthma Last OV assessment/plan:  Assessment & Plan:    Mild persistent asthma without complication - Plan: albuterol (VENTOLIN HFA) 108 (90 Base) MCG/ACT inhaler   Discussion: Tara Farrell is a 41 year old woman, never smoker who returns to pulmonary clinic for follow up of asthma.    She is to continue symbicort 80-4.48mcg 2 puffs twice daily. We will provide her with spacer and monitor for improvement of her throat irritation. She can use albuterol inhaler as needed.    We will check with our pharmacy team about the cost of other ICS/LABA inhalers given her symbicort is $100 per month.    Follow up in 3 months with pulmonary function tests.    Was appointment offered to patient (explain)?  Pt wants recommendations   Reason for call: Called and spoke with pt who states that she has been coughing nonstop for the past 3 days. States that she does cough all throughout the day but states that her cough is worse at night.  Pt had some SOB Sat. 1/21 and did use her rescue inhaler once which she stated made her feel very jittery.  Pt stated that she is using her Symbicort inhaler twice a day as prescribed along with her Singulair, omeprazole, and pepcid.  Pt said that she did take Teraflu overnight 1/22 which did help her be able to get some sleep.  Pt denies any complaints of fever. Along with the cough, pt states that she has had some wheezing and also some chest tightness.  Due to pt's cough and other symptoms, pt wants to know what could be recommended to help with her symptoms. Beth, please advise.   No Known Allergies  Immunization History  Administered Date(s) Administered   PFIZER(Purple Top)SARS-COV-2 Vaccination 01/16/2020, 02/14/2020, 12/13/2020   Tdap 06/24/2013

## 2021-11-03 NOTE — Telephone Encounter (Signed)
Called and spoke with pt letting her know recs per BW and she verbalized understanding. Pt stated that she was not pregnant. Stated to her that Rx had been sent in for her and she verbalized understanding. Nothing further needed.

## 2021-11-03 NOTE — Telephone Encounter (Signed)
I will send in prednisone taper for what sounds like acute asthma flare. If not better after taking needs visit in person. Please verify patient is not pregnant before taking steroid.

## 2021-11-03 NOTE — Telephone Encounter (Signed)
Attempted to call pt but unable to reach. Left message for her to return call. 

## 2021-11-03 NOTE — Telephone Encounter (Signed)
Patient is returning phone call. Patient phone number is [phone]. May leave detailed message on voicemail.

## 2021-12-12 ENCOUNTER — Telehealth: Payer: Self-pay | Admitting: Pulmonary Disease

## 2021-12-12 ENCOUNTER — Encounter: Payer: Self-pay | Admitting: Pulmonary Disease

## 2021-12-12 ENCOUNTER — Ambulatory Visit (INDEPENDENT_AMBULATORY_CARE_PROVIDER_SITE_OTHER): Payer: No Typology Code available for payment source | Admitting: Pulmonary Disease

## 2021-12-12 ENCOUNTER — Other Ambulatory Visit: Payer: Self-pay

## 2021-12-12 ENCOUNTER — Other Ambulatory Visit (HOSPITAL_COMMUNITY): Payer: Self-pay

## 2021-12-12 VITALS — BP 128/84 | HR 100 | Ht 66.0 in | Wt 265.6 lb

## 2021-12-12 DIAGNOSIS — J4530 Mild persistent asthma, uncomplicated: Secondary | ICD-10-CM

## 2021-12-12 DIAGNOSIS — R0981 Nasal congestion: Secondary | ICD-10-CM

## 2021-12-12 DIAGNOSIS — R0683 Snoring: Secondary | ICD-10-CM

## 2021-12-12 DIAGNOSIS — R942 Abnormal results of pulmonary function studies: Secondary | ICD-10-CM

## 2021-12-12 LAB — PULMONARY FUNCTION TEST
DL/VA % pred: 107 %
DL/VA: 4.78 ml/min/mmHg/L
DLCO cor % pred: 67 %
DLCO cor: 15.03 ml/min/mmHg
DLCO unc % pred: 67 %
DLCO unc: 15.03 ml/min/mmHg
FEF 25-75 Post: 1.82 L/sec
FEF 25-75 Pre: 3.74 L/sec
FEF2575-%Change-Post: -51 %
FEF2575-%Pred-Post: 62 %
FEF2575-%Pred-Pre: 128 %
FEV1-%Change-Post: -10 %
FEV1-%Pred-Post: 76 %
FEV1-%Pred-Pre: 85 %
FEV1-Post: 2 L
FEV1-Pre: 2.22 L
FEV1FVC-%Change-Post: -5 %
FEV1FVC-%Pred-Pre: 105 %
FEV6-%Change-Post: -5 %
FEV6-%Pred-Post: 73 %
FEV6-%Pred-Pre: 78 %
FEV6-Post: 2.28 L
FEV6-Pre: 2.43 L
FEV6FVC-%Pred-Post: 101 %
FEV6FVC-%Pred-Pre: 101 %
FVC-%Change-Post: -4 %
FVC-%Pred-Post: 75 %
FVC-%Pred-Pre: 79 %
FVC-Post: 2.39 L
FVC-Pre: 2.51 L
Post FEV1/FVC ratio: 84 %
Post FEV6/FVC ratio: 100 %
Pre FEV1/FVC ratio: 89 %
Pre FEV6/FVC Ratio: 100 %
RV % pred: 68 %
RV: 1.09 L
TLC % pred: 71 %
TLC: 3.68 L

## 2021-12-12 NOTE — Telephone Encounter (Signed)
Thank you JD.

## 2021-12-12 NOTE — Telephone Encounter (Signed)
Pt appears to have a high deductible plan. Test billing is as follows for ICS/LABA: ?Symbicort: $112.96 ?Advair Disckus: $69.35 ?Advair HFA: $117.37 ?Wixela: $69.35 ?Breo: $656.81 ?Dulera: Not Covered ?

## 2021-12-12 NOTE — Progress Notes (Signed)
Synopsis: Referred in November 2022 for shortness of breath, by Tara Brunette, MD  Subjective:   PATIENT ID: Tara Farrell GENDER: female DOB: [birth year], MRN: [id number]  HPI  Chief Complaint  Patient presents with   Follow-up    F/U after PFT. States her breathing has been stable since last visit. Still using Symbicort daily.    Tara Farrell is a 41 year old woman, never smoker who returns to pulmonary clinic for follow up of asthma.   She has been doing well since last visit and is using symbicort 80-4.42mcg 2 puffs twice daily with spacer. She has noticed improvement since using spacer. She had asthma exacerbation in January with improvement with prednisone.   PFTs today show mild restriction and mild diffusing capacity.   She reports history of snoring and reported apneas by her husband. She has some night time awakenings with dyspnea.  OV 10/08/21 She was treated with prednisone and azithromycin at last visit for sinus infection with improvement in her cough and sinus congestion. She ran out of symbicort inhaler and her cough returns but since receiving her prescription her cough is improved but remains. Her symbicort costs $100 per month.  She denies night time awakenings due to cough or wheezing.   She is using fluticasone nasal spray PRN for nasal congestion. She reports headache and night terrors with montelukast and is not taking this medication.   Unable to get PFTs done today due to confusion of scheduling and evidence of covid vaccines.   OV 08/22/21 She reports having cough since 2021 with associated posttussive emesis episodes.  She has noticed increasing dyspnea with exertion and also wheezing.  She has been treated for GERD with famotidine with some improvement in the cough since last December.  She does complain of seasonal allergies year-round which is new for her.  She has lived in [place] her entire life and is not new to the [place] area.  She denies sinus  congestion or drainage but does sound nasally on interview.  She was recently started on Symbicort 80-4 0.5 MCG 2 puffs twice daily and has noticed some improvement in the cough.  She also noticed dry skin of her hands and feet that is peeling since starting the Symbicort inhaler.  She denies any blistering or bumps prior to the peeling of the skin.  Otherwise no skin rashes or joint aches.  No chest imaging on record.  No family history of asthma or lung conditions.  Past Medical History:  Diagnosis Date   Medical history non-contributory    Preterm labor    18 week SAB     No family history on file.   Social History   Socioeconomic History   Marital status: Single    Spouse name: Not on file   Number of children: Not on file   Years of education: Not on file   Highest education level: Not on file  Occupational History   Not on file  Tobacco Use   Smoking status: Never   Smokeless tobacco: Never  Substance and Sexual Activity   Alcohol use: No   Drug use: No   Sexual activity: Not Currently    Birth control/protection: None  Other Topics Concern   Not on file  Social History Narrative   Not on file   Social Determinants of Health   Financial Resource Strain: Not on file  Food Insecurity: Not on file  Transportation Needs: Not on file  Physical Activity: Not on file  Stress: Not on file  Social Connections: Not on file  Intimate Partner Violence: Not on file     No Known Allergies   Outpatient Medications Prior to Visit  Medication Sig Dispense Refill   albuterol (PROVENTIL HFA) 108 (90 Base) MCG/ACT inhaler Inhale 1-2 puffs into the lungs every 6 (six) hours as needed for wheezing or shortness of breath. 3 each 3   famotidine (PEPCID) 40 MG tablet Take 40 mg by mouth daily.     montelukast (SINGULAIR) 10 MG tablet Take 1 tablet (10 mg total) by mouth at bedtime. 30 tablet 11   omeprazole (PRILOSEC) 20 MG capsule Take 20 mg by mouth daily.     SYMBICORT  80-4.5 MCG/ACT inhaler Inhale 2 puffs into the lungs in the morning and at bedtime.     predniSONE (DELTASONE) 10 MG tablet 4 tabs for 2 days, then 3 tabs for 2 days, 2 tabs for 2 days, then 1 tab for 2 days, then stop 20 tablet 0   No facility-administered medications prior to visit.   Review of Systems  Constitutional:  Negative for chills, fever, malaise/fatigue and weight loss.  HENT:  Negative for congestion, sinus pain and sore throat.   Eyes: Negative.   Respiratory:  Negative for cough, hemoptysis, sputum production, shortness of breath and wheezing.   Cardiovascular:  Negative for chest pain, palpitations, orthopnea, claudication and leg swelling.  Gastrointestinal:  Negative for abdominal pain, heartburn, nausea and vomiting.  Genitourinary: Negative.   Musculoskeletal:  Negative for joint pain and myalgias.  Skin:  Negative for rash.  Neurological:  Negative for weakness.  Endo/Heme/Allergies:  Negative for environmental allergies.  Psychiatric/Behavioral: Negative.     Objective:   Vitals:   12/12/21 1014  BP: 128/84  Pulse: 100  SpO2: 100%  Weight: 265 lb 9.6 oz (120.5 kg)  Height: 5\' 6"  (1.676 m)    Physical Exam Constitutional:      General: She is not in acute distress.    Appearance: She is obese. She is not ill-appearing.  HENT:     Head: Normocephalic and atraumatic.  Eyes:     General: No scleral icterus.    Conjunctiva/sclera: Conjunctivae normal.  Cardiovascular:     Rate and Rhythm: Normal rate and regular rhythm.     Pulses: Normal pulses.     Heart sounds: Normal heart sounds. No murmur heard. Pulmonary:     Effort: Pulmonary effort is normal.     Breath sounds: Normal breath sounds. No wheezing, rhonchi or rales.  Musculoskeletal:     Right lower leg: No edema.     Left lower leg: No edema.  Skin:    General: Skin is warm and dry.  Neurological:     General: No focal deficit present.     Mental Status: She is alert.  Psychiatric:         Mood and Affect: Mood normal.        Behavior: Behavior normal.        Thought Content: Thought content normal.        Judgment: Judgment normal.    CBC    Component Value Date/Time   WBC 8.0 06/24/2013 0555   RBC 4.41 06/24/2013 0555   HGB 10.0 (L) 06/24/2013 0555   HCT 30.4 (L) 06/24/2013 0555   PLT 231 06/24/2013 0555   MCV 68.9 (L) 06/24/2013 0555   MCH 22.7 (L) 06/24/2013 0555   MCHC 32.9 06/24/2013 0555   RDW 15.4 06/24/2013 0555  LYMPHSABS 1.6 06/04/2013 0551   MONOABS 0.6 06/04/2013 0551   EOSABS 0.0 06/04/2013 0551   BASOSABS 0.0 06/04/2013 0551   Chest imaging: CXR 09/09/21 Heart size within normal limits. No appreciable airspace consolidation. No evidence of pleural effusion or pneumothorax. No acute bony abnormality identified.  PFT: PFT Results Latest Ref Rng & Units 10/08/2021  FVC-Pre L 2.51  FVC-Predicted Pre % 79  FVC-Post L 2.39  FVC-Predicted Post % 75  Pre FEV1/FVC % % 89  Post FEV1/FCV % % 84  FEV1-Pre L 2.22  FEV1-Predicted Pre % 85  FEV1-Post L 2.00  DLCO uncorrected ml/min/mmHg 15.03  DLCO UNC% % 67  DLCO corrected ml/min/mmHg 15.03  DLCO COR %Predicted % 67  DLVA Predicted % 107  TLC L 3.68  TLC % Predicted % 71  RV % Predicted % 68  PFTs 2023: Mild restriction and mild diffusion defect  Labs:  Path:  Echo:  Heart Catheterization:  Assessment & Plan:   Mild persistent asthma without complication  Sinus congestion  Restrictive ventilatory defect - Plan: CT CHEST HIGH RESOLUTION  Diffusion capacity of lung (dl), decreased - Plan: CT CHEST HIGH RESOLUTION  Snoring - Plan: Home sleep test  Discussion: Tishawna Kimbrell is a 41 year old woman, never smoker who returns to pulmonary clinic for follow up of asthma.   She is to continue symbicort 80-4.27mcg 2 puffs twice daily with spacer as she appears well controlled at this time. She had 1 exacerbation in January requiring prednisone. If she has another exacerbation I will likely  increase her symbicort dosing. She can use albuterol inhaler as needed.   I have messaged our pharmacy team to see if there is another ICS/LABA inhaler that is better covered by her insurance.   We will check a CT chest scan to further evaluate her PFT abnormalities. The restrictive defect could be due to her weight.   We will schedule her for home sleep study for concern of sleep disordered breathing given her history of snoring and witnessed apneas.  Follow up in 6 months.   Melody Comas, MD [facility] Office: [phone]    Current Outpatient Medications:    albuterol (PROVENTIL HFA) 108 (90 Base) MCG/ACT inhaler, Inhale 1-2 puffs into the lungs every 6 (six) hours as needed for wheezing or shortness of breath., Disp: 3 each, Rfl: 3   famotidine (PEPCID) 40 MG tablet, Take 40 mg by mouth daily., Disp: , Rfl:    montelukast (SINGULAIR) 10 MG tablet, Take 1 tablet (10 mg total) by mouth at bedtime., Disp: 30 tablet, Rfl: 11   omeprazole (PRILOSEC) 20 MG capsule, Take 20 mg by mouth daily., Disp: , Rfl:    SYMBICORT 80-4.5 MCG/ACT inhaler, Inhale 2 puffs into the lungs in the morning and at bedtime., Disp: , Rfl:

## 2021-12-12 NOTE — Patient Instructions (Addendum)
Continue symbicort 2 puffs twice daily ?- rinse mouth out after each use ? ?Use albuterol 1-2 puffs every 4-6 hours as needed ? ?Continue montelukast 10mg  daily ? ?Continue omeprazole and famotidine ? ?Start flonase 1 spray per nostril at bed time. Can increase to 2 sprays per nostril if needed. ? ?We will check a CT Chest to evaluate your breathing test findings ? ?We will check a home sleep study to evaluate for sleep apnea ?

## 2021-12-12 NOTE — Telephone Encounter (Signed)
Hello, ? ?Please check which ICS/LABA inhalers are covered by her insurance similar to symbicort 80-4.26mcg or advair 115/70mcg. ? ?Thanks, ?JD ?

## 2021-12-12 NOTE — Progress Notes (Signed)
Full PFT completed today ? ?

## 2022-02-10 ENCOUNTER — Encounter: Payer: Self-pay | Admitting: Pulmonary Disease

## 2022-02-19 ENCOUNTER — Ambulatory Visit: Payer: No Typology Code available for payment source

## 2022-04-10 ENCOUNTER — Encounter: Payer: Self-pay | Admitting: Pulmonary Disease

## 2022-07-10 ENCOUNTER — Emergency Department (HOSPITAL_COMMUNITY)
Admission: EM | Admit: 2022-07-10 | Discharge: 2022-07-11 | Disposition: A | Discharge status: Home / Self Care | Payer: No Typology Code available for payment source | Attending: Emergency Medicine | Admitting: Emergency Medicine

## 2022-07-10 ENCOUNTER — Encounter (HOSPITAL_COMMUNITY): Payer: Self-pay

## 2022-07-10 ENCOUNTER — Other Ambulatory Visit: Payer: Self-pay

## 2022-07-10 DIAGNOSIS — E876 Hypokalemia: Secondary | ICD-10-CM | POA: Diagnosis not present

## 2022-07-10 DIAGNOSIS — R799 Abnormal finding of blood chemistry, unspecified: Secondary | ICD-10-CM | POA: Diagnosis present

## 2022-07-10 DIAGNOSIS — Z7951 Long term (current) use of inhaled steroids: Secondary | ICD-10-CM | POA: Diagnosis not present

## 2022-07-10 DIAGNOSIS — E871 Hypo-osmolality and hyponatremia: Secondary | ICD-10-CM | POA: Insufficient documentation

## 2022-07-10 DIAGNOSIS — M25551 Pain in right hip: Secondary | ICD-10-CM | POA: Insufficient documentation

## 2022-07-10 DIAGNOSIS — R030 Elevated blood-pressure reading, without diagnosis of hypertension: Secondary | ICD-10-CM | POA: Insufficient documentation

## 2022-07-10 DIAGNOSIS — R779 Abnormality of plasma protein, unspecified: Secondary | ICD-10-CM | POA: Diagnosis not present

## 2022-07-10 DIAGNOSIS — R778 Other specified abnormalities of plasma proteins: Secondary | ICD-10-CM

## 2022-07-10 DIAGNOSIS — D509 Iron deficiency anemia, unspecified: Secondary | ICD-10-CM | POA: Insufficient documentation

## 2022-07-10 DIAGNOSIS — M25552 Pain in left hip: Secondary | ICD-10-CM | POA: Insufficient documentation

## 2022-07-10 DIAGNOSIS — J45909 Unspecified asthma, uncomplicated: Secondary | ICD-10-CM | POA: Diagnosis not present

## 2022-07-10 LAB — COMPREHENSIVE METABOLIC PANEL
ALT: 31 U/L (ref 0–44)
AST: 35 U/L (ref 15–41)
Albumin: 3.6 g/dL (ref 3.5–5.0)
Alkaline Phosphatase: 89 U/L (ref 38–126)
Anion gap: 7 (ref 5–15)
BUN: 14 mg/dL (ref 6–20)
CO2: 23 mmol/L (ref 22–32)
Calcium: 9.1 mg/dL (ref 8.9–10.3)
Chloride: 104 mmol/L (ref 98–111)
Creatinine, Ser: 0.44 mg/dL (ref 0.44–1.00)
GFR, Estimated: >60 mL/min (ref >60)
Glucose, Bld: 93 mg/dL (ref 70–99)
Potassium: 3.2 mmol/L — ABNORMAL LOW (ref 3.5–5.1)
Sodium: 134 mmol/L — ABNORMAL LOW (ref 135–145)
Total Bilirubin: 0.4 mg/dL (ref 0.3–1.2)
Total Protein: 8.9 g/dL — ABNORMAL HIGH (ref 6.5–8.1)

## 2022-07-10 LAB — CBC WITH DIFFERENTIAL/PLATELET
Abs Immature Granulocytes: 0.01 10*3/uL (ref 0.00–0.07)
Basophils Absolute: 0 10*3/uL (ref 0.0–0.1)
Basophils Relative: 0 %
Eosinophils Absolute: 0.1 10*3/uL (ref 0.0–0.5)
Eosinophils Relative: 2 %
HCT: 27.1 % — ABNORMAL LOW (ref 36.0–46.0)
Hemoglobin: 7.2 g/dL — ABNORMAL LOW (ref 12.0–15.0)
Immature Granulocytes: 0 %
Lymphocytes Relative: 16 %
Lymphs Abs: 0.8 10*3/uL (ref 0.7–4.0)
MCH: 14.3 pg — ABNORMAL LOW (ref 26.0–34.0)
MCHC: 26.6 g/dL — ABNORMAL LOW (ref 30.0–36.0)
MCV: 54 fL — ABNORMAL LOW (ref 80.0–100.0)
Monocytes Absolute: 0.4 10*3/uL (ref 0.1–1.0)
Monocytes Relative: 8 %
Neutro Abs: 4 10*3/uL (ref 1.7–7.7)
Neutrophils Relative %: 74 %
Platelets: 342 10*3/uL (ref 150–400)
RBC: 5.02 MIL/uL (ref 3.87–5.11)
RDW: 24 % — ABNORMAL HIGH (ref 11.5–15.5)
WBC: 5.3 10*3/uL (ref 4.0–10.5)
nRBC: 0 % (ref 0.0–0.2)

## 2022-07-10 LAB — RETICULOCYTES
Immature Retic Fract: 19.5 % — ABNORMAL HIGH (ref 2.3–15.9)
RBC.: 4.97 MIL/uL (ref 3.87–5.11)
Retic Count, Absolute: 65.1 10*3/uL (ref 19.0–186.0)
Retic Ct Pct: 1.3 % (ref 0.4–3.1)

## 2022-07-10 LAB — IRON AND TIBC
Iron: 23 ug/dL — ABNORMAL LOW (ref 28–170)
Saturation Ratios: 4 % — ABNORMAL LOW (ref 10.4–31.8)
TIBC: 565 ug/dL — ABNORMAL HIGH (ref 250–450)
UIBC: 542 ug/dL

## 2022-07-10 LAB — FERRITIN: Ferritin: 3 ng/mL — ABNORMAL LOW (ref 11–307)

## 2022-07-10 LAB — TYPE AND SCREEN

## 2022-07-10 LAB — FOLATE: Folate: 6.9 ng/mL (ref >5.9)

## 2022-07-10 LAB — PROTIME-INR
INR: 1.1 (ref 0.8–1.2)
Prothrombin Time: 13.7 seconds (ref 11.4–15.2)

## 2022-07-10 NOTE — ED Triage Notes (Addendum)
Ambulatory to ED after having routine bloodwork done for some leg pain she was having. Told she had low hemoglobin and needed a blood transfusion. Hbg 7.2 on her records. Denies any shortness of breath, dizziness, lightheadedness, bloody stools, heavy periods.   Also would like at CT scan done while she's here - has outpt order for one.

## 2022-07-10 NOTE — ED Provider Triage Note (Signed)
Emergency Medicine Provider Triage Evaluation Note  Tara Farrell , a 41 y.o. female  was evaluated in triage.  Pt complains of abnormal lab.  Patient states that she was being seen at orthopedics today and had screening labs done.  She was found to have a hemoglobin of 7.2.  She has not had her labs drawn for several years prior to this.  Says she has not felt abnormal at all lately.  She denies any shortness of breath, rectal bleeding, dark stools, hematuria, menorrhagia, recent trauma.  She does not feel fatigued, dizzy, short of breath.  Has never been diagnosed with anemia before.  Review of Systems  Positive:  Negative:   Physical Exam  BP (!) 160/84   Pulse 99   Temp 98.2 F (36.8 C)   Resp 20   Ht 5\' 6"  (1.676 m)   Wt 120.5 kg   SpO2 98%   BMI 42.88 kg/m  Gen:   Awake, no distress   Resp:  Normal effort  MSK:   Moves extremities without difficulty  Other:    Medical Decision Making  Medically screening exam initiated at 7:44 PM.  Appropriate orders placed.  Tara Farrell was informed that the remainder of the evaluation will be completed by another provider, this initial triage assessment does not replace that evaluation, and the importance of remaining in the ED until their evaluation is complete.     Adolphus Birchwood, [place] 07/10/22 1945

## 2022-07-11 LAB — VITAMIN B12: Vitamin B-12: 7450 pg/mL — ABNORMAL HIGH (ref 180–914)

## 2022-07-11 LAB — TYPE AND SCREEN: DAT, IgG: NEGATIVE

## 2022-07-11 MED ORDER — FERROUS SULFATE 325 (65 FE) MG PO TABS: ORAL_TABLET | ORAL | 0 refills | Status: AC

## 2022-07-11 MED ORDER — POTASSIUM CHLORIDE CRYS ER 20 MEQ PO TBCR
40.0000 meq | EXTENDED_RELEASE_TABLET | Freq: Once | ORAL | Status: AC
  Administered 2022-07-11: 40 meq via ORAL
  Filled 2022-07-11: qty 2

## 2022-07-11 MED ORDER — POTASSIUM CHLORIDE CRYS ER 20 MEQ PO TBCR: 20.0000 meq | EXTENDED_RELEASE_TABLET | Freq: Two times a day (BID) | ORAL | 0 refills | Status: DC

## 2022-07-11 NOTE — Discharge Instructions (Addendum)
Your blood work clearly shows you have iron deficiency anemia.  This most likely is from menstrual blood loss.  Please follow-up with the gynecologist for evaluation to see if something can be done to limit your menstrual blood loss.  Your total protein level was high today.  That can sometimes be from a blood cancer called myeloma, sometimes from a benign condition called MGUS, and sometimes not from anything serious at all.  Please have your primary care provider either do some additional testing regarding it, or refer you to a hematologist for further evaluation.  Your blood pressure was high today.  That may be due to to all the stress that you are going through today.  However, it is possible that you also have high blood pressure.  Please have your blood pressure rechecked several times in the next 1-2 weeks.  If your blood pressure continues to stay high, you may need to be on medication to control it.  Inadequately controlled high blood pressure can lead to heart attacks, strokes, kidney failure.

## 2022-07-11 NOTE — ED Provider Notes (Signed)
[facility] DEPT Provider Note   CSN: [id number] Arrival date & time: 07/10/22  1848     History  Chief Complaint  Patient presents with   Abnormal Lab    Tara Farrell is a 41 y.o. female.  The history is provided by the patient.  Abnormal Lab She has history of asthma, and was sent to the emergency department because of an abnormal MRI scan and low hemoglobin.  She saw an orthopedic doctor today for evaluation of bilateral hip and pelvic pain.  She had an MRI scan but this is unclear if it was lumbar spine or pelvis) which is reported to have shown some enlarged lymph nodes and a CT scan was ordered to evaluate for additional lymph nodes.  She also had some blood work done which showed a very low hemoglobin and she was advised to come to the emergency department.  She apparently was unable to have the CT scan done and only knows that it was something involving the upper body but does not know exactly what scan was ordered.  She has had some generalized fatigue over the last 2 months which she relates to having had COVID.  She denies any black or tarry bowel movements.  Her menses are moderately heavy with 6 days of flow approximately 4-6 pads a day.  She denies weight loss, night sweats, fevers, chills.   Home Medications Prior to Admission medications   Medication Sig Start Date End Date Taking? Authorizing Provider  albuterol (PROVENTIL HFA) 108 (90 Base) MCG/ACT inhaler Inhale 1-2 puffs into the lungs every 6 (six) hours as needed for wheezing or shortness of breath. 10/23/21   Freddi Starr, MD  famotidine (PEPCID) 40 MG tablet Take 40 mg by mouth daily. 07/25/21   [provider]  montelukast (SINGULAIR) 10 MG tablet Take 1 tablet (10 mg total) by mouth at bedtime. 08/22/21   Freddi Starr, MD  omeprazole (PRILOSEC) 20 MG capsule Take 20 mg by mouth daily. 07/25/21   [provider]  SYMBICORT 80-4.5 MCG/ACT inhaler Inhale 2  puffs into the lungs in the morning and at bedtime. 07/25/21   [provider]      Allergies    Patient has no known allergies.    Review of Systems   Review of Systems  All other systems reviewed and are negative.   Physical Exam Updated Vital Signs BP (!) 160/84   Pulse 99   Temp 98.2 F (36.8 C)   Resp 20   Ht 5' 6"  (1.676 m)   Wt 120.5 kg   SpO2 98%   BMI 42.88 kg/m  Physical Exam Vitals and nursing note reviewed.   42 year old female, resting comfortably and in no acute distress. Vital signs are significant for elevated systolic blood pressure. Oxygen saturation is 98%, which is normal. Head is normocephalic and atraumatic. PERRLA, EOMI. Oropharynx is clear. Neck is nontender and supple without adenopathy or JVD. Back is nontender and there is no CVA tenderness. Lungs are clear without rales, wheezes, or rhonchi. Chest is nontender. Heart has regular rate and rhythm without murmur. Abdomen is soft, flat, nontender. Extremities have no cyanosis or edema. Skin is warm and dry without rash. Neurologic: Mental status is normal, cranial nerves are intact, moves all extremities equally.  ED Results / Procedures / Treatments   Labs (all labs ordered are listed, but only abnormal results are displayed) Labs Reviewed  CBC WITH DIFFERENTIAL/PLATELET - Abnormal; Notable for the  following components:      Result Value   Hemoglobin 7.2 (*)    HCT 27.1 (*)    MCV 54.0 (*)    MCH 14.3 (*)    MCHC 26.6 (*)    RDW 24.0 (*)    All other components within normal limits  COMPREHENSIVE METABOLIC PANEL - Abnormal; Notable for the following components:   Sodium 134 (*)    Potassium 3.2 (*)    Total Protein 8.9 (*)    All other components within normal limits  IRON AND TIBC - Abnormal; Notable for the following components:   Iron 23 (*)    TIBC 565 (*)    Saturation Ratios 4 (*)    All other components within normal limits  FERRITIN - Abnormal; Notable for the  following components:   Ferritin 3 (*)    All other components within normal limits  RETICULOCYTES - Abnormal; Notable for the following components:   Immature Retic Fract 19.5 (*)    All other components within normal limits  PROTIME-INR  FOLATE  VITAMIN B12  TYPE AND SCREEN   Procedures Procedures    Medications Ordered in ED Medications  potassium chloride SA (KLOR-CON M) CR tablet 40 mEq (has no administration in time range)    ED Course/ Medical Decision Making/ A&P Clinical Course as of 07/11/22 0001  Fri Jul 10, 2022  2134 Hemoglobin(!): 7.2 [GL]    Clinical Course User Index [GL] Adolphus Birchwood, PA-C                           Medical Decision Making Amount and/or Complexity of Data Reviewed Labs: ordered.   Moderately severe anemia, likely iron deficiency.  Report of some enlarged lymph nodes, but patient is unclear which lymph nodes are enlarged.  Pelvic and hip pain of uncertain cause.  I have reviewed and interpreted all of her laboratory tests which were done here, and my interpretation is mild hyponatremia doubtful clinical significance, mild hypokalemia and she is given dose of oral potassium, elevated total protein with little normal albumin.  This could be indicative of multiple myeloma, MGUS.  Other labs also showed severe microcytic anemia with hemoglobin of 7.2 compared with 10.0 on 9/13-20 14.  Anemia profile confirms iron deficiency anemia.  This is most likely from menstrual blood loss.  Patient wished to have the CT scan done which had been ordered by her orthopedic doctor.  However, I am unable to access that office note or the results of the MRI scan done earlier today.  I am not sure that she actually needs a CT scan just to evaluate possible adenopathy.  If anything, she may need evaluation of her elevated total protein which can be done as an outpatient.  I have discussed with this with the patient at length.  I have ordered prescriptions for potassium  and iron replacement.  I have referred her to outpatient gynecology for evaluation of her menses and I have referred her back to her primary care provider for either further outpatient work-up or referral to hematology.  Final Clinical Impression(s) / ED Diagnoses Final diagnoses:  Iron deficiency anemia, unspecified iron deficiency anemia type  Elevated total protein  Bilateral hip pain  Elevated blood pressure reading without diagnosis of hypertension  Hypokalemia  Hyponatremia    Rx / DC Orders ED Discharge Orders          Ordered    ferrous sulfate 325 (  65 FE) MG tablet        07/11/22 0040    potassium chloride SA (KLOR-CON M) 20 MEQ tablet  2 times daily        07/11/22 3846              Delora Fuel, MD 65/99/35 [phone]

## 2022-07-11 NOTE — ED Provider Notes (Incomplete)
[facility] Provider Note   CSN: [id number] Arrival date & time: 07/10/22  1848     History {Add pertinent medical, surgical, social history, OB history to HPI:1} Chief Complaint  Patient presents with  . Abnormal Lab    Tara Farrell is a 41 y.o. female.  The history is provided by the patient.  Abnormal Lab      Home Medications Prior to Admission medications   Medication Sig Start Date End Date Taking? Authorizing Provider  albuterol (PROVENTIL HFA) 108 (90 Base) MCG/ACT inhaler Inhale 1-2 puffs into the lungs every 6 (six) hours as needed for wheezing or shortness of breath. 10/23/21   Freddi Starr, MD  famotidine (PEPCID) 40 MG tablet Take 40 mg by mouth daily. 07/25/21   [provider]  montelukast (SINGULAIR) 10 MG tablet Take 1 tablet (10 mg total) by mouth at bedtime. 08/22/21   Freddi Starr, MD  omeprazole (PRILOSEC) 20 MG capsule Take 20 mg by mouth daily. 07/25/21   [provider]  SYMBICORT 80-4.5 MCG/ACT inhaler Inhale 2 puffs into the lungs in the morning and at bedtime. 07/25/21   [provider]      Allergies    Patient has no known allergies.    Review of Systems   Review of Systems  All other systems reviewed and are negative.   Physical Exam Updated Vital Signs BP (!) 160/84   Pulse 99   Temp 98.2 F (36.8 C)   Resp 20   Ht 5\' 6"  (1.676 m)   Wt 120.5 kg   SpO2 98%   BMI 42.88 kg/m  Physical Exam Vitals and nursing note reviewed.   41 year old female, resting comfortably and in no acute distress. Vital signs are ***. Oxygen saturation is ***%, which is normal. Head is normocephalic and atraumatic. PERRLA, EOMI. Oropharynx is clear. Neck is nontender and supple without adenopathy or JVD. Back is nontender and there is no CVA tenderness. Lungs are clear without rales, wheezes, or rhonchi. Chest is nontender. Heart has regular rate and rhythm without  murmur. Abdomen is soft, flat, nontender without masses or hepatosplenomegaly and peristalsis is normoactive. Extremities have no cyanosis or edema, full range of motion is present. Skin is warm and dry without rash. Neurologic: Mental status is normal, cranial nerves are intact, there are no motor or sensory deficits.  ED Results / Procedures / Treatments   Labs (all labs ordered are listed, but only abnormal results are displayed) Labs Reviewed  CBC WITH DIFFERENTIAL/PLATELET - Abnormal; Notable for the following components:      Result Value   Hemoglobin 7.2 (*)    HCT 27.1 (*)    MCV 54.0 (*)    MCH 14.3 (*)    MCHC 26.6 (*)    RDW 24.0 (*)    All other components within normal limits  COMPREHENSIVE METABOLIC PANEL - Abnormal; Notable for the following components:   Sodium 134 (*)    Potassium 3.2 (*)    Total Protein 8.9 (*)    All other components within normal limits  IRON AND TIBC - Abnormal; Notable for the following components:   Iron 23 (*)    TIBC 565 (*)    Saturation Ratios 4 (*)    All other components within normal limits  FERRITIN - Abnormal; Notable for the following components:   Ferritin 3 (*)    All other components within normal limits  RETICULOCYTES - Abnormal; Notable for the  following components:   Immature Retic Fract 19.5 (*)    All other components within normal limits  PROTIME-INR  FOLATE  VITAMIN B12  TYPE AND SCREEN    EKG None  Radiology No results found.  Procedures Procedures  {Document cardiac monitor, telemetry assessment procedure when appropriate:1}  Medications Ordered in ED Medications - No data to display  ED Course/ Medical Decision Making/ A&P Clinical Course as of 07/11/22 0001  Fri Jul 10, 2022  2134 Hemoglobin(!): 7.2 [GL]    Clinical Course User Index [GL] Claudie Leach, PA-C                           Medical Decision Making Amount and/or Complexity of Data Reviewed Labs: ordered.   ***  {Document  critical care time when appropriate:1} {Document review of labs and clinical decision tools ie heart score, Chads2Vasc2 etc:1}  {Document your independent review of radiology images, and any outside records:1} {Document your discussion with family members, caretakers, and with consultants:1} {Document social determinants of health affecting pt's care:1} {Document your decision making why or why not admission, treatments were needed:1} Final Clinical Impression(s) / ED Diagnoses Final diagnoses:  None    Rx / DC Orders ED Discharge Orders     None

## 2022-07-13 ENCOUNTER — Encounter: Payer: Self-pay | Admitting: Oncology

## 2022-07-13 ENCOUNTER — Telehealth: Payer: Self-pay | Admitting: *Deleted

## 2022-07-13 ENCOUNTER — Other Ambulatory Visit: Payer: Self-pay | Admitting: Sports Medicine

## 2022-07-13 ENCOUNTER — Inpatient Hospital Stay: Payer: No Typology Code available for payment source

## 2022-07-13 ENCOUNTER — Inpatient Hospital Stay: Payer: No Typology Code available for payment source | Attending: Oncology | Admitting: Oncology

## 2022-07-13 ENCOUNTER — Other Ambulatory Visit: Payer: Self-pay

## 2022-07-13 VITALS — BP 137/79 | HR 102 | Temp 98.6°F | Resp 14 | Ht 64.6 in | Wt 247.3 lb

## 2022-07-13 DIAGNOSIS — R5383 Other fatigue: Secondary | ICD-10-CM | POA: Insufficient documentation

## 2022-07-13 DIAGNOSIS — N921 Excessive and frequent menstruation with irregular cycle: Secondary | ICD-10-CM | POA: Insufficient documentation

## 2022-07-13 DIAGNOSIS — Z23 Encounter for immunization: Secondary | ICD-10-CM | POA: Insufficient documentation

## 2022-07-13 DIAGNOSIS — U099 Post covid-19 condition, unspecified: Secondary | ICD-10-CM

## 2022-07-13 DIAGNOSIS — R591 Generalized enlarged lymph nodes: Secondary | ICD-10-CM | POA: Diagnosis not present

## 2022-07-13 DIAGNOSIS — R599 Enlarged lymph nodes, unspecified: Secondary | ICD-10-CM | POA: Diagnosis not present

## 2022-07-13 DIAGNOSIS — M60005 Infective myositis, unspecified leg: Secondary | ICD-10-CM

## 2022-07-13 DIAGNOSIS — M25552 Pain in left hip: Secondary | ICD-10-CM | POA: Diagnosis not present

## 2022-07-13 DIAGNOSIS — Z7952 Long term (current) use of systemic steroids: Secondary | ICD-10-CM | POA: Insufficient documentation

## 2022-07-13 DIAGNOSIS — R590 Localized enlarged lymph nodes: Secondary | ICD-10-CM

## 2022-07-13 DIAGNOSIS — M609 Myositis, unspecified: Secondary | ICD-10-CM | POA: Diagnosis not present

## 2022-07-13 DIAGNOSIS — Z8616 Personal history of COVID-19: Secondary | ICD-10-CM | POA: Insufficient documentation

## 2022-07-13 DIAGNOSIS — D500 Iron deficiency anemia secondary to blood loss (chronic): Secondary | ICD-10-CM

## 2022-07-13 LAB — LACTATE DEHYDROGENASE: LDH: 157 U/L (ref 98–192)

## 2022-07-13 LAB — TYPE AND SCREEN
ABO/RH(D): A POS
Antibody Screen: POSITIVE

## 2022-07-13 LAB — HEPATITIS PANEL, ACUTE
HCV Ab: NONREACTIVE
Hep A IgM: NONREACTIVE
Hep B C IgM: NONREACTIVE
Hepatitis B Surface Ag: NONREACTIVE

## 2022-07-13 LAB — T4, FREE: Free T4: 1.08 ng/dL (ref 0.61–1.12)

## 2022-07-13 LAB — TSH: TSH: 0.633 u[IU]/mL (ref 0.350–4.500)

## 2022-07-13 LAB — HIV ANTIBODY (ROUTINE TESTING W REFLEX): HIV Screen 4th Generation wRfx: NONREACTIVE

## 2022-07-13 LAB — HCG, SERUM, QUALITATIVE: Preg, Serum: NEGATIVE

## 2022-07-13 NOTE — Telephone Encounter (Signed)
Received a call from [facility] in [place] with Stat referral for low Hgb.  Dr Marin Olp reviewed chart and notes from patients ED visit. New patient appt can be scheduled next week.  Chart given to [facility].  [name] notified LM[facility]

## 2022-07-13 NOTE — Progress Notes (Signed)
[facility] Initial Visit:  Patient Care Team: Carol Ada, MD as PCP - General (Family Medicine)  CHIEF COMPLAINTS/PURPOSE OF CONSULTATION:  Oncology History   No history exists.    HISTORY OF PRESENTING ILLNESS: Tara Farrell 40 y.o. female is here because of anemia, myalgias, adenopathy.    September 09/2013: WBC 8.5 hemoglobin 11.4 MCV 69 platelet count 242   June 18, 2022:  Patient had COVID 19 in August 2023.  Her symptoms included fatigue, generalized myalgias, sore throat, anorexia, diarrhea, HA, anosmia, decreased sense of taste  She had a lot of nasal congestion, SOB.  She was treated with Paxlovid.  Sense of smell is coming back.  Myalgias improved after Paxlovid but she developed new myalgias involving her legs which began in September 2023.    Saw PA at PCP's office for evaluation of bilateral hip and leg pain.  Left hip pain began a week prior.  Patient was instructed to begin meloxicam and methocarbamol without relief of symptoms.  June 20 2022:  Presented to urgent care with hip and leg pain.  Treated with steroid injections to both hips for presumed bursitis without benefit.  June 21 2022:  Began steroid dose pack without benefit.    July 10, 2022:  Seen at [facility].   MRI of the right hip showed a small hip joint effusion.  No muscle or tendon signal abnormalities.  Multiple and large internal and external iliac lymph nodes the largest measuring up to 1.6 cm in short axis and a 3.2 cm right ovarian cyst.   Presented to [facility] health emergency room with shortness of breath, dizziness, lightheadedness, WBC 5.3 hemoglobin 7.2 MCV 54 platelet count 342; 74 segs 16 lymphs 8 monos 2 eos.  Teardrops polychromasia and target cells noted.  Reticulocyte count 1.3% INR 1.1   CMP notable for sodium 134 potassium 3.2 creatinine 0.44 T. bili 0.4 B12 7450 folate 6.9 ferritin 3  Patient was placed on iron iron and potassium.  She was  referred to primary care physician  Other laboratories notable for CRP of 6 (upper limit of normal 10) CPK 502 (upper limit of normal 165)  July 13 2022:  [facility]  Patient is G2 P1011 Menopause not reached.  Menses occur monthly and regular and last 5 to 6 days.  Bleeding is moderate.  Does not have bleeding between periods.    Does not pass clots.  Last pregnancy was delivered by NSVD section in 2014.  No postpartum hemorrhage.    Patient does not have history of uterine fibroids/uterine abnormalities.  Has taken oral iron in the past.  Has not received IV iron/ required PRBC's in the past.  Has tolerated oral iron owing to GI intolerance.   Has a normal diet.  No hematochezia, melena, hemoptysis, hematuria.   No history of intra-articular or soft tissue bleeding  No history of abnormal bleeding in family members Patient has symptoms of fatigue, pallor,  DOE, decreased performance status.  Patient has pica to ice.    Social:  Married.  Works for [employer].  Tobacco none.  EtOH occasionally  [facility] Mother alive 26 Graves Disease, HTN Father alive 10 none that patient is aware of Brother alive 46 well Sister alive 28 well  Review of Systems  Constitutional:  Positive for fatigue. Negative for chills, fever and unexpected weight change.  HENT:   Negative for mouth sores, nosebleeds, sore throat and trouble swallowing.   Eyes:  Negative for  eye problems and icterus.  Respiratory:  Negative for chest tightness, cough, hemoptysis and shortness of breath.   Cardiovascular:  Negative for chest pain, leg swelling and palpitations.  Gastrointestinal:  Negative for abdominal pain, blood in stool, constipation, diarrhea and nausea.  Genitourinary:  Negative for difficulty urinating, dysuria and hematuria.   Musculoskeletal:  Positive for arthralgias and myalgias. Negative for gait problem.       Occasional lumbar pain  Neurological:  Negative for gait problem, headaches,  light-headedness and numbness.       Occasional dizziness  Hematological:  Negative for adenopathy. Does not bruise/bleed easily.  Psychiatric/Behavioral:  Negative for depression.        Awakes in the middle of the night    MEDICAL HISTORY: Past Medical History:  Diagnosis Date   GERD (gastroesophageal reflux disease)    Medical history non-contributory    Preterm labor    18 week SAB    SURGICAL HISTORY: Past Surgical History:  Procedure Laterality Date   CERVICAL CERCLAGE N/A 03/21/2013   Procedure: CERCLAGE CERVICAL;  Surgeon: Lovenia Kim, MD;  Location: [facility];  Service: Gynecology;  Laterality: N/A;  EDD: 09/25/13   DIAGNOSTIC LAPAROSCOPY     DILATION AND CURETTAGE OF UTERUS      SOCIAL HISTORY: Social History   Socioeconomic History   Marital status: Married    Spouse name: Rodman Key   Number of children: 1   Years of education: 14   Highest education level: Some college, no degree  Occupational History   Not on file  Tobacco Use   Smoking status: Never   Smokeless tobacco: Never  Vaping Use   Vaping Use: Never used  Substance and Sexual Activity   Alcohol use: No   Drug use: No   Sexual activity: Not Currently    Birth control/protection: None  Other Topics Concern   Not on file  Social History Narrative   Not on file   Social Determinants of Health   Financial Resource Strain: Not on file  Food Insecurity: Not on file  Transportation Needs: Not on file  Physical Activity: Not on file  Stress: Not on file  Social Connections: Not on file  Intimate Partner Violence: Not on file    FAMILY HISTORY Family History  Problem Relation Age of Onset   Healthy Mother    Healthy Father     ALLERGIES:  has No Known Allergies.  MEDICATIONS:  Current Outpatient Medications  Medication Sig Dispense Refill   albuterol (PROVENTIL HFA) 108 (90 Base) MCG/ACT inhaler Inhale 1-2 puffs into the lungs every 6 (six) hours as needed for wheezing or shortness  of breath. 3 each 3   famotidine (PEPCID) 40 MG tablet Take 40 mg by mouth daily.     ferrous sulfate 325 (65 FE) MG tablet Take one tablet a day for five days. If you are tolerating it, increase to twice a day. If after another five days you are still tolerating it, increase to three times a day. At any point if you develop intolerable side effects, go back to the lower dose. 90 tablet 0   montelukast (SINGULAIR) 10 MG tablet Take 1 tablet (10 mg total) by mouth at bedtime. 30 tablet 11   omeprazole (PRILOSEC) 20 MG capsule Take 20 mg by mouth daily.     potassium chloride SA (KLOR-CON M) 20 MEQ tablet Take 1 tablet (20 mEq total) by mouth 2 (two) times daily. 10 tablet 0   predniSONE (DELTASONE) 50  MG tablet Take 50 mg by mouth daily with breakfast.     SYMBICORT 80-4.5 MCG/ACT inhaler Inhale 2 puffs into the lungs in the morning and at bedtime.     No current facility-administered medications for this visit.    PHYSICAL EXAMINATION:  ECOG PERFORMANCE STATUS: 1 - Symptomatic but completely ambulatory   Vitals:   07/13/22 1356  BP: 137/79  Pulse: (!) 102  Resp: 14  Temp: 98.6 F (37 C)  SpO2: 99%    Filed Weights   07/13/22 1356  Weight: 247 lb 4.8 oz (112.2 kg)     Physical Exam Vitals and nursing note reviewed.  Constitutional:      General: She is not in acute distress.    Appearance: Normal appearance. She is not ill-appearing, toxic-appearing or diaphoretic.     Comments: Here with husband Uncomfortable appearing.  Rubs her thighs during the evaluation to assuage muscle pain  HENT:     Head: Normocephalic and atraumatic.     Right Ear: External ear normal.     Left Ear: External ear normal.     Nose: Nose normal. No congestion or rhinorrhea.  Eyes:     General: No scleral icterus.    Extraocular Movements: Extraocular movements intact.     Conjunctiva/sclera: Conjunctivae normal.     Pupils: Pupils are equal, round, and reactive to light.  Cardiovascular:      Rate and Rhythm: Normal rate and regular rhythm.     Heart sounds: Normal heart sounds. No murmur heard.    No friction rub. No gallop.  Pulmonary:     Effort: Pulmonary effort is normal. No respiratory distress.     Breath sounds: Normal breath sounds. No stridor. No wheezing, rhonchi or rales.  Chest:     Chest wall: No tenderness.  Abdominal:     General: Bowel sounds are normal. There is no distension.     Palpations: Abdomen is soft. There is no mass.     Tenderness: There is no abdominal tenderness. There is no rebound.  Musculoskeletal:        General: No swelling, tenderness or deformity.     Cervical back: Normal range of motion and neck supple. No rigidity or tenderness.  Lymphadenopathy:     Head:     Right side of head: No submental, submandibular, tonsillar, preauricular, posterior auricular or occipital adenopathy.     Left side of head: No submental, submandibular, tonsillar, preauricular, posterior auricular or occipital adenopathy.     Cervical: No cervical adenopathy.     Right cervical: No superficial, deep or posterior cervical adenopathy.    Left cervical: No superficial, deep or posterior cervical adenopathy.     Upper Body:     Right upper body: No supraclavicular, axillary, pectoral or epitrochlear adenopathy.     Left upper body: No supraclavicular, axillary, pectoral or epitrochlear adenopathy.  Skin:    General: Skin is warm.     Coloration: Skin is not jaundiced or pale.     Findings: No bruising or erythema.  Neurological:     General: No focal deficit present.     Mental Status: She is alert and oriented to person, place, and time. Mental status is at baseline.     Cranial Nerves: No cranial nerve deficit.  Psychiatric:        Mood and Affect: Mood normal.        Behavior: Behavior normal.        Thought Content: Thought content normal.  Judgment: Judgment normal.     LABORATORY DATA: I have personally reviewed the data as  listed:  Office Visit on 07/13/2022  Component Date Value Ref Range Status   Hepatitis B Surface Ag 07/13/2022 NON REACTIVE  NON REACTIVE Final   HCV Ab 07/13/2022 NON REACTIVE  NON REACTIVE Final   Comment: (NOTE) Nonreactive HCV antibody screen is consistent with no HCV infections,  unless recent infection is suspected or other evidence exists to indicate HCV infection.     Hep A IgM 07/13/2022 NON REACTIVE  NON REACTIVE Final   Hep B C IgM 07/13/2022 NON REACTIVE  NON REACTIVE Final   Performed at [facility], [place] [address], [place], [place] [address]   HIV Screen 4th Generation wRfx 07/13/2022 Non Reactive  Non Reactive Final   Performed at [facility], [place] [address], [place], [place] [address]   RPR Ser Ql 07/13/2022 NON REACTIVE  NON REACTIVE Final   Performed at [facility], [place] [address], [place], [place] [address]   Free T4 07/13/2022 1.08  0.61 - 1.12 ng/dL Final   Comment: (NOTE) Biotin ingestion may interfere with free T4 tests. If the results are inconsistent with the TSH level, previous test results, or the clinical presentation, then consider biotin interference. If needed, order repeat testing after stopping biotin. Performed at [facility], [place] [address], [place], [place] [address]    TSH 07/13/2022 0.633  0.350 - 4.500 uIU/mL Final   Comment: Performed by a 3rd Generation assay with a functional sensitivity of <=0.01 uIU/mL. Performed at [facility], [place] [address], [place], [place] [address]   Appointment on 07/13/2022  Component Date Value Ref Range Status   LDH 07/13/2022 157  98 - 192 U/L Final   Performed at [facility], [place] [address] [place], [place] [address]   ds DNA Ab 07/13/2022 <1  0 - 9 IU/mL Final   Comment: (NOTE)                                   Negative      <5                                   Equivocal  5 - 9                                   Positive      >9     Ribonucleic Protein 07/13/2022 0.5  0.0 - 0.9 AI Final   ENA SM Ab Ser-aCnc 07/13/2022 <0.2  0.0 - 0.9 AI Final   Scleroderma (Scl-70) (ENA) Antibod* 07/13/2022 <0.2  0.0 - 0.9 AI Final   SSA (Ro) (ENA) Antibody, IgG 07/13/2022 <0.2  0.0 - 0.9 AI Final   SSB (La) (ENA) Antibody, IgG 07/13/2022 <0.2  0.0 - 0.9 AI Final   Chromatin Ab SerPl-aCnc 07/13/2022 0.2  0.0 - 0.9 AI Final   Anti JO-1 07/13/2022 <0.2  0.0 - 0.9 AI Final   Centromere Ab Screen 07/13/2022 <0.2  0.0 - 0.9 AI Final   See below: 07/13/2022 Comment   Final   Comment: (NOTE) Pattern              Potential  Disease Association -------------  --------------------------------------------- Homogeneous    Systemic Lupus Erythematosus, Drug Induced               Systemic Lupus Erythematosus, Chronic               Autoimmune hepatitis, Juvenile Idiopathic               Arthritis -------------  --------------------------------------------- Speckled       Sjogren Syndrome, Systemic Lupus               Erythematosus, Subacute Cutaneous Lupus,               Neonatal Lupus, Congenital Heart Block,               Mixed Connective Tissue Disease,               Scleroderma-diffuse, Scleroderma-Autoimmune               Myositis Overlap Syndrome, Systemic Lupus               Erythematosus-Scleroderma-Autoimmune               Myositis Overlap Syndrome, Systemic               Autoimmune Rheumatic Disease,               Undifferentiated Connective Tissue Disease -------------  --------------------------------------------- Nucleolar      Systemic Sclero                          sis, Scleroderma-Autoimmune               Myositis Overlap Syndrome, Sjogren               Syndrome, Raynaud phenomenon, Pulmonary               Arterial Hypertension, Systemic Autoimmune               Rheumatic Disease, Cancer -------------  --------------------------------------------- Centromere     Scleroderma-CREST, Limited Cutaneous SSc,                Raynaud's Phenomenon, Primary Biliary               Cholangitis -------------  --------------------------------------------- Nuclear Dot    Primary Biliary Cholangitis -------------  --------------------------------------------- Nuclear        Primary Biliary Cholangitis, Autoimmune Membrane       Hepatitis/Liver disease, Systemic Autoimmune               Rheumatic Disease, Autoimmune Cytopenias,               Linear Scleroderma, Antiphospholipid Syndrome -------------  --------------------------------------------- Performed At: [facility] [place], [place] [id number] Rush Farmer MD Ph:8                          [phone]    Preg, Serum 07/13/2022 NEGATIVE  NEGATIVE Final   Comment:        THE SENSITIVITY OF THIS METHODOLOGY IS >10 mIU/mL. Performed at [facility], [place] [address], [place], [place] [address]    DAT, complement 07/13/2022 NEG   Final   DAT, IgG 07/13/2022 POS   Final   Antibody ID,T [place] 07/13/2022    Final                   Value:NO SPECIFIC ANTIBODY  DEMONSTRATED IN ELUATE Performed at [facility], [place] [address], [place], [place] [address]   Admission on 07/10/2022, Discharged on 07/11/2022  Component Date Value Ref Range Status   WBC 07/10/2022 5.3  4.0 - 10.5 K/uL Final   RBC 07/10/2022 5.02  3.87 - 5.11 MIL/uL Final   Hemoglobin 07/10/2022 7.2 (L)  12.0 - 15.0 g/dL Final   Comment: Reticulocyte Hemoglobin testing may be clinically indicated, consider ordering this additional test [id number]    HCT 07/10/2022 27.1 (L)  36.0 - 46.0 % Final   MCV 07/10/2022 54.0 (L)  80.0 - 100.0 fL Final   MCH 07/10/2022 14.3 (L)  26.0 - 34.0 pg Final   MCHC 07/10/2022 26.6 (L)  30.0 - 36.0 g/dL Final   RDW 07/10/2022 24.0 (H)  11.5 - 15.5 % Final   Platelets 07/10/2022 342  150 - 400 K/uL Final   nRBC 07/10/2022 0.0  0.0 - 0.2 % Final   Neutrophils Relative % 07/10/2022 74  % Final   Neutro Abs  07/10/2022 4.0  1.7 - 7.7 K/uL Final   Lymphocytes Relative 07/10/2022 16  % Final   Lymphs Abs 07/10/2022 0.8  0.7 - 4.0 K/uL Final   Monocytes Relative 07/10/2022 8  % Final   Monocytes Absolute 07/10/2022 0.4  0.1 - 1.0 K/uL Final   Eosinophils Relative 07/10/2022 2  % Final   Eosinophils Absolute 07/10/2022 0.1  0.0 - 0.5 K/uL Final   Basophils Relative 07/10/2022 0  % Final   Basophils Absolute 07/10/2022 0.0  0.0 - 0.1 K/uL Final   Immature Granulocytes 07/10/2022 0  % Final   Abs Immature Granulocytes 07/10/2022 0.01  0.00 - 0.07 K/uL Final   Tear Drop Cells 07/10/2022 PRESENT   Final   Polychromasia 07/10/2022 PRESENT   Final   Target Cells 07/10/2022 PRESENT   Final   Ovalocytes 07/10/2022 PRESENT   Final   Performed at [facility], [place] [address], [place], [place] [address]   ABO/RH(D) 07/10/2022 A POS   Final   Antibody Screen 07/10/2022 POS   Final   Sample Expiration 07/10/2022 07/13/2022,2359   Final   Antibody Identification 24/40/1027 NO CLINICALLY SIGNIFICANT ANTIBODY IDENTIFIED   Final   DAT, IgG 07/10/2022    Final                   Value:NEG Performed at [place] [address], [place], [place] [address]    Prothrombin Time 07/10/2022 13.7  11.4 - 15.2 seconds Final   INR 07/10/2022 1.1  0.8 - 1.2 Final   Comment: (NOTE) INR goal varies based on device and disease states. Performed at [facility], [place] [address] [place], [place] [address]    Sodium 07/10/2022 134 (L)  135 - 145 mmol/L Final   Potassium 07/10/2022 3.2 (L)  3.5 - 5.1 mmol/L Final   Chloride 07/10/2022 104  98 - 111 mmol/L Final   CO2 07/10/2022 23  22 - 32 mmol/L Final   Glucose, Bld 07/10/2022 93  70 - 99 mg/dL Final   Glucose reference range applies only to samples taken after fasting for at least 8 hours.   BUN 07/10/2022 14  6 - 20 mg/dL Final   Creatinine, Ser 07/10/2022 0.44  0.44 - 1.00 mg/dL Final   Calcium 07/10/2022  9.1  8.9 - 10.3 mg/dL Final   Total Protein 07/10/2022 8.9 (H)  6.5 - 8.1 g/dL Final   Albumin  07/10/2022 3.6  3.5 - 5.0 g/dL Final   AST 07/10/2022 35  15 - 41 U/L Final   ALT 07/10/2022 31  0 - 44 U/L Final   Alkaline Phosphatase 07/10/2022 89  38 - 126 U/L Final   Total Bilirubin 07/10/2022 0.4  0.3 - 1.2 mg/dL Final   GFR, Estimated 07/10/2022 >60  >60 mL/min Final   Comment: (NOTE) Calculated using the CKD-EPI Creatinine Equation (2021)    Anion gap 07/10/2022 7  5 - 15 Final   Performed at [facility], [place] [address] [place], [place] [address]   Vitamin B-12 07/10/2022 7,450 (H)  180 - 914 pg/mL Final   Comment: RESULTS CONFIRMED BY MANUAL DILUTION Performed at [facility] [address] [place], [place] [address]    Folate 07/10/2022 6.9  >5.9 ng/mL Final   Performed at [place] [address], [place], [place] [address]   Iron 07/10/2022 23 (L)  28 - 170 ug/dL Final   TIBC 07/10/2022 565 (H)  250 - 450 ug/dL Final   Saturation Ratios 07/10/2022 4 (L)  10.4 - 31.8 % Final   UIBC 07/10/2022 542  ug/dL Final   Performed at [place] [address] [place], [place] [address]   Ferritin 07/10/2022 3 (L)  11 - 307 ng/mL Final   Performed at [place] [address] [place], [place] [address]   Retic Ct Pct 07/10/2022 1.3  0.4 - 3.1 % Final   RBC. 07/10/2022 4.97  3.87 - 5.11 MIL/uL Final   Retic Count, Absolute 07/10/2022 65.1  19.0 - 186.0 K/uL Final   Immature Retic Fract 07/10/2022 19.5 (H)  2.3 - 15.9 % Final   Performed at [facility], [place] [address] [place], [place] [address]    RADIOGRAPHIC STUDIES: I have personally reviewed the radiological images as listed and agree with the findings in the report  No results found.  ASSESSMENT/PLAN  Patient is a 41 year old female with symptomatic microcytic anemia presumed to be secondary to  dysfunctional uterine bleeding.  Patient has also developed myalgias    Anemia:  Most likely etiology is iron deficiency anemia owing to dysfunctional uterine bleeding/ exacerbated by high demand owing to prior pregnancies.  Will obtain  DAT, Haptoglobin.  Consider future GI referral for pan endoscopy  Dysfunctional uterine bleeding:  This is characterized by menses that are prolonged, irregular as well as by heavy bleeding with passage of clots.  Will need to evaluate for possible bleeding disorder with PT, PTT, Fibrinogen, von Willebrand screen.    Therapeutics:  Since patient has symptomatic anemia with Hgb < 10  and has not tolerated/been compliant with oral iron will arrange for IV iron replacement.   Given the severe symptoms we prefer to replete iron stores in one or two visits rather than over the course of several months.  In addition ongoing blood loss exceeds the capacity of oral iron to meet needs. A discussion regarding risks was had with the patient.  IV iron has the potential to cause allergic reactions, including potentially life-threatening anaphylaxis.   IV iron may be associated with non-allergic infusion reactions including self-limiting urticaria, palpitations, dizziness, and neck and back spasm; generally, these occur in <1 percent of individuals and do not progress to more serious reactions. The non-allergic reaction consisting of flushing of the face and myalgias of the chest and back.  After discussion of the risks and  benefits of IV iron therapy patient has elected to proceed with parenteral iron therapy.    Lymphadenopathy:  Was noted on MRI femoral region performed on July 10 2022.  There is  abroad differential for this which includes autoimmune disorders, lymphoma, and infections.  Lymphadenopathy is a common finding in the setting of COVID 19 infections.  Will obtain CT CAP to determine if this is generalized.  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  Will obtain rheumatoid markers and check nasal swab to make certain patient has cleared the virus.  Will consider Prednisone 1 mg/kg daily for treatment.  Will also look into [facility].       Cancer Staging  No matching staging information was found for the patient.   No problem-specific Assessment & Plan notes found for this encounter.   Orders Placed This Encounter  Procedures   CT CHEST ABDOMEN PELVIS W CONTRAST    Standing Status:   Future    Standing Expiration Date:   07/14/2023    Order Specific Question:   Is patient pregnant?    Answer:   No    Order Specific Question:   Preferred imaging location?    Answer:   External    Order Specific Question:   Is Oral Contrast requested for this exam?    Answer:   Yes, Per Radiology protocol   Copper, serum    Standing Status:   Future    Number of Occurrences:   1    Standing Expiration Date:   07/14/2023   Haptoglobin    Standing Status:   Future    Number of Occurrences:   1    Standing Expiration Date:   07/14/2023   Zinc    Standing Status:   Future    Number of Occurrences:   1    Standing Expiration Date:   07/14/2023   Hgb Fractionation Cascade    Standing Status:   Future    Number of Occurrences:   1    Standing Expiration Date:   07/14/2023   Kappa/lambda light chains    Standing Status:   Future    Number of Occurrences:   1    Standing Expiration Date:   07/14/2023   Multiple Myeloma Panel (SPEP&IFE w/QIG)    Standing Status:   Future    Number of Occurrences:   1    Standing Expiration Date:   07/14/2023   CK isoenzymes (brain, muscle injury)    Standing Status:   Future    Number of Occurrences:   1    Standing Expiration Date:   07/14/2023   Myoglobin, serum   TSH + free T4    Standing Status:   Future    Standing Expiration Date:   07/14/2023   CMV dna by pcr, qualitative   Hepatitis panel, acute    HIV Antibody (routine testing w rflx)   RPR   Lyme Disease Serology w/Reflex   Epstein barr vrs(ebv dna by pcr)   Rheumatoid factor    Standing Status:   Future    Number of Occurrences:   1    Standing Expiration Date:   07/14/2023   Lactate dehydrogenase    Standing Status:   Future    Number of Occurrences:   1    Standing Expiration Date:   07/14/2023   ANCA Profile  Standing Status:   Future    Standing Expiration Date:   07/14/2023   C3 and C4    Standing Status:   Future    Number of Occurrences:   1    Standing Expiration Date:   07/13/2023   ANA Comprehensive Panel    Standing Status:   Future    Number of Occurrences:   1    Standing Expiration Date:   07/14/2023   Respiratory Panel w/ SARS-CoV2   hCG, serum, qualitative    Standing Status:   Future    Number of Occurrences:   1    Standing Expiration Date:   07/14/2023   T4, free   TSH   Direct antiglobulin test (not at [facility])    Standing Status:   Future    Number of Occurrences:   1    Standing Expiration Date:   07/14/2023    All questions were answered. The patient knows to call the clinic with any problems, questions or concerns.  This note was electronically signed.    Barbee Cough, MD  07/14/2022 11:23 AM

## 2022-07-14 ENCOUNTER — Ambulatory Visit
Admission: RE | Admit: 2022-07-14 | Discharge: 2022-07-14 | Disposition: A | Discharge status: Home / Self Care | Payer: No Typology Code available for payment source | Source: Ambulatory Visit | Attending: Sports Medicine | Admitting: Sports Medicine

## 2022-07-14 ENCOUNTER — Encounter: Payer: Self-pay | Admitting: Oncology

## 2022-07-14 DIAGNOSIS — R590 Localized enlarged lymph nodes: Secondary | ICD-10-CM

## 2022-07-14 DIAGNOSIS — U099 Post covid-19 condition, unspecified: Secondary | ICD-10-CM | POA: Insufficient documentation

## 2022-07-14 LAB — MYOGLOBIN, SERUM: Myoglobin: 42 ng/mL (ref 25–58)

## 2022-07-14 LAB — KAPPA/LAMBDA LIGHT CHAINS
Kappa free light chain: 25.6 mg/L — ABNORMAL HIGH (ref 3.3–19.4)
Kappa, lambda light chain ratio: 1.4 (ref 0.26–1.65)
Lambda free light chains: 18.3 mg/L (ref 5.7–26.3)

## 2022-07-14 LAB — RPR: RPR Ser Ql: NONREACTIVE

## 2022-07-14 LAB — ANA COMPREHENSIVE PANEL
Anti JO-1: <0.2 AI (ref 0.0–0.9)
Centromere Ab Screen: <0.2 AI (ref 0.0–0.9)
Chromatin Ab SerPl-aCnc: 0.2 AI (ref 0.0–0.9)
ENA SM Ab Ser-aCnc: <0.2 AI (ref 0.0–0.9)
Ribonucleic Protein: 0.5 AI (ref 0.0–0.9)
SSA (Ro) (ENA) Antibody, IgG: <0.2 AI (ref 0.0–0.9)
SSB (La) (ENA) Antibody, IgG: <0.2 AI (ref 0.0–0.9)
Scleroderma (Scl-70) (ENA) Antibody, IgG: <0.2 AI (ref 0.0–0.9)
ds DNA Ab: <1 IU/mL (ref 0–9)

## 2022-07-14 LAB — RHEUMATOID FACTOR: Rheumatoid fact SerPl-aCnc: <10 IU/mL (ref <14.0)

## 2022-07-14 LAB — LYME DISEASE SEROLOGY W/REFLEX: Lyme Total Antibody EIA: NEGATIVE

## 2022-07-14 LAB — C3 AND C4
C3 Complement: 169 mg/dL — ABNORMAL HIGH (ref 82–167)
Complement C4, Body Fluid: 11 mg/dL — ABNORMAL LOW (ref 12–38)

## 2022-07-14 LAB — DIRECT ANTIGLOBULIN TEST (NOT AT ARMC)
DAT, IgG: POSITIVE
DAT, complement: NEGATIVE

## 2022-07-14 LAB — HAPTOGLOBIN: Haptoglobin: 213 mg/dL (ref 33–278)

## 2022-07-14 MED ORDER — IOPAMIDOL (ISOVUE-300) INJECTION 61%
100.0000 mL | Freq: Once | INTRAVENOUS | Status: AC | PRN
  Administered 2022-07-14: 100 mL via INTRAVENOUS

## 2022-07-14 NOTE — Progress Notes (Unsigned)
[facility] Cancer Follow up Visit:  Patient Care Team: Merri Brunette, MD as PCP - General (Family Medicine)  CHIEF COMPLAINTS/PURPOSE OF CONSULTATION:  Oncology History   No history exists.    HISTORY OF PRESENTING ILLNESS: Tara Farrell 41 y.o. female is here because of anemia, myalgias, adenopathy.    September 09/2013: WBC 8.5 hemoglobin 11.4 MCV 69 platelet count 242   June 18, 2022:  Patient had COVID 19 in August 2023.  Her symptoms included fatigue, generalized myalgias, sore throat, anorexia, diarrhea, HA, anosmia, decreased sense of taste  She had a lot of nasal congestion, SOB.  She was treated with Paxlovid.  Sense of smell is coming back.  Myalgias improved after Paxlovid but she developed new myalgias involving her legs which began in September 2023.    Saw PA at PCP's office for evaluation of bilateral hip and leg pain.  Left hip pain began a week prior.  Patient was instructed to begin meloxicam and methocarbamol without relief of symptoms.  June 20 2022:  Presented to urgent care with hip and leg pain.  Treated with steroid injections to both hips for presumed bursitis without benefit.  June 21 2022:  Began steroid dose pack without benefit.    July 10, 2022:  Seen at [facility].   MRI of the right hip showed a small hip joint effusion.  No muscle or tendon signal abnormalities.  Multiple and large internal and external iliac lymph nodes the largest measuring up to 1.6 cm in short axis and a 3.2 cm right ovarian cyst.   Presented to [facility] emergency room with shortness of breath, dizziness, lightheadedness, WBC 5.3 hemoglobin 7.2 MCV 54 platelet count 342; 74 segs 16 lymphs 8 monos 2 eos.  Teardrops polychromasia and target cells noted.  Reticulocyte count 1.3% INR 1.1   CMP notable for sodium 134 potassium 3.2 creatinine 0.44 T. bili 0.4 B12 7450 folate 6.9 ferritin 3  Patient was placed on iron iron and potassium.  She was  referred to primary care physician  Other laboratories notable for CRP of 6 (upper limit of normal 10) CPK 502 (upper limit of normal 165)  July 13 2022:  [facility] Hematology Consult  Patient is G2 P1011 Menopause not reached.  Menses occur monthly and regular and last 5 to 6 days.  Bleeding is moderate.  Does not have bleeding between periods.    Does not pass clots.  Last pregnancy was delivered by NSVD section in 2014.  No postpartum hemorrhage.    Patient does not have history of uterine fibroids/uterine abnormalities.  Has taken oral iron in the past.  Has not received IV iron/ required PRBC's in the past.  Has tolerated oral iron owing to GI intolerance.   Has a normal diet.  No hematochezia, melena, hemoptysis, hematuria.   No history of intra-articular or soft tissue bleeding  No history of abnormal bleeding in family members Patient has symptoms of fatigue, pallor,  DOE, decreased performance status.  Patient has pica to ice.    Social:  Married.  Works for [employer].  Tobacco none.  EtOH occasionally  [facility] Mother alive 23 Graves Disease, HTN Father alive 108 none that patient is aware of Brother alive 22 well Sister alive 40 well  Review of Systems  Constitutional:  Positive for fatigue. Negative for chills, fever and unexpected weight change.  HENT:   Negative for mouth sores, nosebleeds, sore throat and trouble swallowing.   Eyes:  Negative  for eye problems and icterus.  Respiratory:  Negative for chest tightness, cough, hemoptysis and shortness of breath.   Cardiovascular:  Negative for chest pain, leg swelling and palpitations.  Gastrointestinal:  Negative for abdominal pain, blood in stool, constipation, diarrhea and nausea.  Genitourinary:  Negative for difficulty urinating, dysuria and hematuria.   Musculoskeletal:  Positive for arthralgias and myalgias. Negative for gait problem.       Occasional lumbar pain  Neurological:  Negative for gait problem, headaches,  light-headedness and numbness.       Occasional dizziness  Hematological:  Negative for adenopathy. Does not bruise/bleed easily.  Psychiatric/Behavioral:  Negative for depression.        Awakes in the middle of the night    MEDICAL HISTORY: Past Medical History:  Diagnosis Date   GERD (gastroesophageal reflux disease)    Medical history non-contributory    Preterm labor    18 week SAB    SURGICAL HISTORY: Past Surgical History:  Procedure Laterality Date   CERVICAL CERCLAGE N/A 03/21/2013   Procedure: CERCLAGE CERVICAL;  Surgeon: Lovenia Kim, MD;  Location: [facility];  Service: Gynecology;  Laterality: N/A;  EDD: 09/25/13   DIAGNOSTIC LAPAROSCOPY     DILATION AND CURETTAGE OF UTERUS      SOCIAL HISTORY: Social History   Socioeconomic History   Marital status: Married    Spouse name: Rodman Key   Number of children: 1   Years of education: 14   Highest education level: Some college, no degree  Occupational History   Not on file  Tobacco Use   Smoking status: Never   Smokeless tobacco: Never  Vaping Use   Vaping Use: Never used  Substance and Sexual Activity   Alcohol use: No   Drug use: No   Sexual activity: Not Currently    Birth control/protection: None  Other Topics Concern   Not on file  Social History Narrative   Not on file   Social Determinants of Health   Financial Resource Strain: Not on file  Food Insecurity: Not on file  Transportation Needs: Not on file  Physical Activity: Not on file  Stress: Not on file  Social Connections: Not on file  Intimate Partner Violence: Not on file    FAMILY HISTORY Family History  Problem Relation Age of Onset   Healthy Mother    Healthy Father     ALLERGIES:  has No Known Allergies.  MEDICATIONS:  Current Outpatient Medications  Medication Sig Dispense Refill   albuterol (PROVENTIL HFA) 108 (90 Base) MCG/ACT inhaler Inhale 1-2 puffs into the lungs every 6 (six) hours as needed for wheezing or shortness  of breath. 3 each 3   famotidine (PEPCID) 40 MG tablet Take 40 mg by mouth daily.     ferrous sulfate 325 (65 FE) MG tablet Take one tablet a day for five days. If you are tolerating it, increase to twice a day. If after another five days you are still tolerating it, increase to three times a day. At any point if you develop intolerable side effects, go back to the lower dose. 90 tablet 0   montelukast (SINGULAIR) 10 MG tablet Take 1 tablet (10 mg total) by mouth at bedtime. 30 tablet 11   omeprazole (PRILOSEC) 20 MG capsule Take 20 mg by mouth daily.     potassium chloride SA (KLOR-CON M) 20 MEQ tablet Take 1 tablet (20 mEq total) by mouth 2 (two) times daily. 10 tablet 0   predniSONE (DELTASONE)  50 MG tablet Take 50 mg by mouth daily with breakfast.     SYMBICORT 80-4.5 MCG/ACT inhaler Inhale 2 puffs into the lungs in the morning and at bedtime.     No current facility-administered medications for this visit.    PHYSICAL EXAMINATION:  ECOG PERFORMANCE STATUS: 1 - Symptomatic but completely ambulatory   There were no vitals filed for this visit.   There were no vitals filed for this visit.    Physical Exam Vitals and nursing note reviewed.  Constitutional:      General: She is not in acute distress.    Appearance: Normal appearance. She is not ill-appearing, toxic-appearing or diaphoretic.     Comments: Here with husband Uncomfortable appearing.  Rubs her thighs during the evaluation to assuage muscle pain  HENT:     Head: Normocephalic and atraumatic.     Right Ear: External ear normal.     Left Ear: External ear normal.     Nose: Nose normal. No congestion or rhinorrhea.  Eyes:     General: No scleral icterus.    Extraocular Movements: Extraocular movements intact.     Conjunctiva/sclera: Conjunctivae normal.     Pupils: Pupils are equal, round, and reactive to light.  Cardiovascular:     Rate and Rhythm: Normal rate and regular rhythm.     Heart sounds: Normal heart  sounds. No murmur heard.    No friction rub. No gallop.  Pulmonary:     Effort: Pulmonary effort is normal. No respiratory distress.     Breath sounds: Normal breath sounds. No stridor. No wheezing, rhonchi or rales.  Chest:     Chest wall: No tenderness.  Abdominal:     General: Bowel sounds are normal. There is no distension.     Palpations: Abdomen is soft. There is no mass.     Tenderness: There is no abdominal tenderness. There is no rebound.  Musculoskeletal:        General: No swelling, tenderness or deformity.     Cervical back: Normal range of motion and neck supple. No rigidity or tenderness.  Lymphadenopathy:     Head:     Right side of head: No submental, submandibular, tonsillar, preauricular, posterior auricular or occipital adenopathy.     Left side of head: No submental, submandibular, tonsillar, preauricular, posterior auricular or occipital adenopathy.     Cervical: No cervical adenopathy.     Right cervical: No superficial, deep or posterior cervical adenopathy.    Left cervical: No superficial, deep or posterior cervical adenopathy.     Upper Body:     Right upper body: No supraclavicular, axillary, pectoral or epitrochlear adenopathy.     Left upper body: No supraclavicular, axillary, pectoral or epitrochlear adenopathy.  Skin:    General: Skin is warm.     Coloration: Skin is not jaundiced or pale.     Findings: No bruising or erythema.  Neurological:     General: No focal deficit present.     Mental Status: She is alert and oriented to person, place, and time. Mental status is at baseline.     Cranial Nerves: No cranial nerve deficit.  Psychiatric:        Mood and Affect: Mood normal.        Behavior: Behavior normal.        Thought Content: Thought content normal.        Judgment: Judgment normal.     LABORATORY DATA: I have personally reviewed the data as  listed:  Office Visit on 07/13/2022  Component Date Value Ref Range Status   Myoglobin  07/13/2022 42  25 - 58 ng/mL Final   Comment: (NOTE) Performed At: [facility] [address] [place], [place] [id number] Jolene SchimkeNagendra Sanjai MD NW:[phone]    Hepatitis B Surface Ag 07/13/2022 NON REACTIVE  NON REACTIVE Final   HCV Ab 07/13/2022 NON REACTIVE  NON REACTIVE Final   Comment: (NOTE) Nonreactive HCV antibody screen is consistent with no HCV infections,  unless recent infection is suspected or other evidence exists to indicate HCV infection.     Hep A IgM 07/13/2022 NON REACTIVE  NON REACTIVE Final   Hep B C IgM 07/13/2022 NON REACTIVE  NON REACTIVE Final   Performed at [facility], [address] [place], [place] [address]   HIV Screen 4th Generation wRfx 07/13/2022 Non Reactive  Non Reactive Final   Performed at [facility], [address] [place], [place] [address]   RPR Ser Ql 07/13/2022 NON REACTIVE  NON REACTIVE Final   Performed at [facility], [address] [place], [place] [address]   Lyme Total Antibody EIA 07/13/2022 Negative  Negative Final   Comment: (NOTE) Lyme antibodies not detected. Reflex testing is not indicated. No laboratory evidence of infection with B. burgdorferi (Lyme disease). Negative results may occur in patients recently infected (less than or equal to 14 days) with B. burgdorferi.  If recent infection is suspected, repeat testing on a new sample collected in 7 to 14 days is recommended. Performed At: [facility] [address] [place], [place] [id number] Jolene SchimkeNagendra Sanjai MD ZD:[phone]    Free T4 07/13/2022 1.08  0.61 - 1.12 ng/dL Final   Comment: (NOTE) Biotin ingestion may interfere with free T4 tests. If the results are inconsistent with the TSH level, previous test results, or the clinical presentation, then consider biotin interference. If needed, order repeat testing after stopping biotin. Performed at [facility], [address] [place], [place] [address]    TSH 07/13/2022 0.633   0.350 - 4.500 uIU/mL Final   Comment: Performed by a 3rd Generation assay with a functional sensitivity of <=0.01 uIU/mL. Performed at [facility], [address] [place], [place] [address]   Appointment on 07/13/2022  Component Date Value Ref Range Status   LDH 07/13/2022 157  98 - 192 U/L Final   Performed at [facility], [address] [place], [place] [address]   ds DNA Ab 07/13/2022 <1  0 - 9 IU/mL Final   Comment: (NOTE)                                   Negative      <5                                   Equivocal  5 - 9                                   Positive      >9    Ribonucleic Protein 07/13/2022 0.5  0.0 - 0.9 AI Final   ENA SM Ab Ser-aCnc 07/13/2022 <0.2  0.0 - 0.9 AI Final   Scleroderma (Scl-70) (ENA) Antibod* 07/13/2022 <  0.2  0.0 - 0.9 AI Final   SSA (Ro) (ENA) Antibody, IgG 07/13/2022 <0.2  0.0 - 0.9 AI Final   SSB (La) (ENA) Antibody, IgG 07/13/2022 <0.2  0.0 - 0.9 AI Final   Chromatin Ab SerPl-aCnc 07/13/2022 0.2  0.0 - 0.9 AI Final   Anti JO-1 07/13/2022 <0.2  0.0 - 0.9 AI Final   Centromere Ab Screen 07/13/2022 <0.2  0.0 - 0.9 AI Final   See below: 07/13/2022 Comment   Final   Comment: (NOTE) Pattern              Potential Disease Association -------------  --------------------------------------------- Homogeneous    Systemic Lupus Erythematosus, Drug Induced               Systemic Lupus Erythematosus, Chronic               Autoimmune hepatitis, Juvenile Idiopathic               Arthritis -------------  --------------------------------------------- Speckled       Sjogren Syndrome, Systemic Lupus               Erythematosus, Subacute Cutaneous Lupus,               Neonatal Lupus, Congenital Heart Block,               Mixed Connective Tissue Disease,               Scleroderma-diffuse, Scleroderma-Autoimmune               Myositis Overlap Syndrome, Systemic Lupus               Erythematosus-Scleroderma-Autoimmune                Myositis Overlap Syndrome, Systemic               Autoimmune Rheumatic Disease,               Undifferentiated Connective Tissue Disease -------------  --------------------------------------------- Nucleolar      Systemic Sclero                          sis, Scleroderma-Autoimmune               Myositis Overlap Syndrome, Sjogren               Syndrome, Raynaud phenomenon, Pulmonary               Arterial Hypertension, Systemic Autoimmune               Rheumatic Disease, Cancer -------------  --------------------------------------------- Centromere     Scleroderma-CREST, Limited Cutaneous SSc,               Raynaud's Phenomenon, Primary Biliary               Cholangitis -------------  --------------------------------------------- Nuclear Dot    Primary Biliary Cholangitis -------------  --------------------------------------------- Nuclear        Primary Biliary Cholangitis, Autoimmune Membrane       Hepatitis/Liver disease, Systemic Autoimmune               Rheumatic Disease, Autoimmune Cytopenias,               Linear Scleroderma, Antiphospholipid Syndrome -------------  --------------------------------------------- Performed At: [facility] [address] [place], [place] [id number] Jolene Schimke MD Ph:[phone]  [phone]    Preg, Serum 07/13/2022 NEGATIVE  NEGATIVE Final   Comment:        THE SENSITIVITY OF THIS METHODOLOGY IS >10 mIU/mL. Performed at [facility], [address] [place], [place] [address]    DAT, complement 07/13/2022 NEG   Final   DAT, IgG 07/13/2022 POS   Final   Antibody ID,T [place] 07/13/2022    Final                   Value:NO SPECIFIC ANTIBODY DEMONSTRATED IN ELUATE Performed at [facility], [address], [place], [place] [address]   Admission on 07/10/2022, Discharged on 07/11/2022  Component Date Value Ref Range Status   WBC 07/10/2022 5.3  4.0 - 10.5 K/uL Final   RBC  07/10/2022 5.02  3.87 - 5.11 MIL/uL Final   Hemoglobin 07/10/2022 7.2 (L)  12.0 - 15.0 g/dL Final   Comment: Reticulocyte Hemoglobin testing may be clinically indicated, consider ordering this additional test [id number]    HCT 07/10/2022 27.1 (L)  36.0 - 46.0 % Final   MCV 07/10/2022 54.0 (L)  80.0 - 100.0 fL Final   MCH 07/10/2022 14.3 (L)  26.0 - 34.0 pg Final   MCHC 07/10/2022 26.6 (L)  30.0 - 36.0 g/dL Final   RDW 08/65/7846 24.0 (H)  11.5 - 15.5 % Final   Platelets 07/10/2022 342  150 - 400 K/uL Final   nRBC 07/10/2022 0.0  0.0 - 0.2 % Final   Neutrophils Relative % 07/10/2022 74  % Final   Neutro Abs 07/10/2022 4.0  1.7 - 7.7 K/uL Final   Lymphocytes Relative 07/10/2022 16  % Final   Lymphs Abs 07/10/2022 0.8  0.7 - 4.0 K/uL Final   Monocytes Relative 07/10/2022 8  % Final   Monocytes Absolute 07/10/2022 0.4  0.1 - 1.0 K/uL Final   Eosinophils Relative 07/10/2022 2  % Final   Eosinophils Absolute 07/10/2022 0.1  0.0 - 0.5 K/uL Final   Basophils Relative 07/10/2022 0  % Final   Basophils Absolute 07/10/2022 0.0  0.0 - 0.1 K/uL Final   Immature Granulocytes 07/10/2022 0  % Final   Abs Immature Granulocytes 07/10/2022 0.01  0.00 - 0.07 K/uL Final   Tear Drop Cells 07/10/2022 PRESENT   Final   Polychromasia 07/10/2022 PRESENT   Final   Target Cells 07/10/2022 PRESENT   Final   Ovalocytes 07/10/2022 PRESENT   Final   Performed at [facility], [address], [place], [place] [address]   ABO/RH(D) 07/10/2022 A POS   Final   Antibody Screen 07/10/2022 POS   Final   Sample Expiration 07/10/2022 07/13/2022,2359   Final   Antibody Identification 07/10/2022 NO CLINICALLY SIGNIFICANT ANTIBODY IDENTIFIED   Final   DAT, IgG 07/10/2022    Final                   Value:NEG Performed at [facility], [address] [place], [place] [address]    Prothrombin Time 07/10/2022 13.7  11.4 - 15.2 seconds Final   INR 07/10/2022 1.1  0.8 - 1.2 Final    Comment: (NOTE) INR goal varies based on device and disease states. Performed at [facility], [address], [place], [place] [address]    Sodium 07/10/2022 134 (L)  135 - 145 mmol/L Final   Potassium 07/10/2022 3.2 (L)  3.5 - 5.1 mmol/L Final   Chloride 07/10/2022 104  98 - 111  mmol/L Final   CO2 07/10/2022 23  22 - 32 mmol/L Final   Glucose, Bld 07/10/2022 93  70 - 99 mg/dL Final   Glucose reference range applies only to samples taken after fasting for at least 8 hours.   BUN 07/10/2022 14  6 - 20 mg/dL Final   Creatinine, Ser 07/10/2022 0.44  0.44 - 1.00 mg/dL Final   Calcium 16/07/9603 9.1  8.9 - 10.3 mg/dL Final   Total Protein 54/06/8118 8.9 (H)  6.5 - 8.1 g/dL Final   Albumin 14/78/2956 3.6  3.5 - 5.0 g/dL Final   AST 21/30/8657 35  15 - 41 U/L Final   ALT 07/10/2022 31  0 - 44 U/L Final   Alkaline Phosphatase 07/10/2022 89  38 - 126 U/L Final   Total Bilirubin 07/10/2022 0.4  0.3 - 1.2 mg/dL Final   GFR, Estimated 07/10/2022 >60  >60 mL/min Final   Comment: (NOTE) Calculated using the CKD-EPI Creatinine Equation (2021)    Anion gap 07/10/2022 7  5 - 15 Final   Performed at [facility], [address] [place], [place] [address]   Vitamin B-12 07/10/2022 7,450 (H)  180 - 914 pg/mL Final   Comment: RESULTS CONFIRMED BY MANUAL DILUTION Performed at [facility], [address] [place], [place] [address]    Folate 07/10/2022 6.9  >5.9 ng/mL Final   Performed at [facility], [address] [place], [place] [address]   Iron 07/10/2022 23 (L)  28 - 170 ug/dL Final   TIBC 40/07/2724 565 (H)  250 - 450 ug/dL Final   Saturation Ratios 07/10/2022 4 (L)  10.4 - 31.8 % Final   UIBC 07/10/2022 542  ug/dL Final   Performed at [facility], [address], [place], [place] [address]   Ferritin 07/10/2022 3 (L)  11 - 307 ng/mL Final   Performed at [facility], [address] [place], [place] [address]   Retic Ct Pct 07/10/2022 1.3  0.4 - 3.1 % Final   RBC. 07/10/2022 4.97  3.87 - 5.11 MIL/uL Final   Retic Count, Absolute 07/10/2022 65.1  19.0 - 186.0 K/uL Final   Immature Retic Fract 07/10/2022 19.5 (H)  2.3 - 15.9 % Final   Performed at [facility], [address], [place], [place] [address]    RADIOGRAPHIC STUDIES: I have personally reviewed the radiological images as listed and agree with the findings in the report  No results found.  ASSESSMENT/PLAN  Patient is a 41 year old female with symptomatic microcytic anemia presumed to be secondary to dysfunctional uterine bleeding.  Patient has also developed myalgias    Anemia:  Most likely etiology is iron deficiency anemia owing to dysfunctional uterine bleeding/ exacerbated by high demand owing to prior pregnancies.  Will obtain  DAT, Haptoglobin.  Consider future GI referral for pan endoscopy  Dysfunctional uterine bleeding:  This is characterized by menses that are prolonged, irregular as well as by heavy bleeding with passage of clots.  Will need to evaluate for possible bleeding disorder with PT, PTT, Fibrinogen, von Willebrand screen.    Therapeutics:  Since patient has symptomatic anemia with Hgb < 10  and has not tolerated/been compliant with oral iron will arrange for IV iron replacement.   Given the severe symptoms we prefer to replete iron stores in one or two visits rather than over the course of several months.  In addition ongoing blood loss  exceeds the capacity of oral iron to meet needs. A discussion regarding risks was had with the patient.  IV iron has the potential to cause allergic reactions, including potentially life-threatening anaphylaxis.   IV iron may be associated with non-allergic infusion reactions including self-limiting urticaria, palpitations, dizziness, and neck and back spasm; generally, these occur in <1 percent of individuals and do not progress to  more serious reactions. The non-allergic reaction consisting of flushing of the face and myalgias of the chest and back.  After discussion of the risks and benefits of IV iron therapy patient has elected to proceed with parenteral iron therapy.    Lymphadenopathy:  Was noted on MRI femoral region performed on July 10 2022.  There is  abroad differential for this which includes autoimmune disorders, lymphoma, and infections.  Lymphadenopathy is a common finding in the setting of COVID 19 infections.  Will obtain CT CAP to determine if this is generalized.  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  Will obtain rheumatoid markers and check nasal swab to make certain patient has cleared the virus.  Will consider Prednisone 1 mg/kg daily for treatment.  Will also look into Long Covid Clinic.       Cancer Staging  No matching staging information was found for the patient.   No problem-specific Assessment & Plan notes found for this encounter.   No orders of the defined types were placed in this encounter.   All questions were answered. The patient knows to call the clinic with any problems, questions or concerns.  This note was electronically signed.    Loni Muse, MD  07/14/2022 1:55 PM

## 2022-07-15 LAB — HGB FRACTIONATION CASCADE
Hgb A2: 2.2 % (ref 1.8–3.2)
Hgb A: 97.8 % (ref 96.4–98.8)
Hgb F: 0 % (ref 0.0–2.0)
Hgb S: 0 %

## 2022-07-15 LAB — EPSTEIN BARR VRS(EBV DNA BY PCR)
EBV DNA QN by PCR: 52 IU/mL
log10 EBV DNA Qn PCR: 1.716 log10 IU/mL

## 2022-07-15 MED FILL — Ferumoxytol Inj 510 MG/17ML (30 MG/ML) (Elemental Fe): INTRAVENOUS | Qty: 17 | Status: AC

## 2022-07-16 ENCOUNTER — Other Ambulatory Visit: Payer: Self-pay

## 2022-07-16 ENCOUNTER — Encounter: Payer: Self-pay | Admitting: Oncology

## 2022-07-16 ENCOUNTER — Inpatient Hospital Stay (INDEPENDENT_AMBULATORY_CARE_PROVIDER_SITE_OTHER): Payer: No Typology Code available for payment source | Admitting: Oncology

## 2022-07-16 ENCOUNTER — Inpatient Hospital Stay: Payer: No Typology Code available for payment source

## 2022-07-16 ENCOUNTER — Telehealth: Payer: Self-pay | Admitting: Oncology

## 2022-07-16 VITALS — BP 142/84 | HR 104 | Temp 98.1°F | Ht 64.6 in | Wt 247.4 lb

## 2022-07-16 VITALS — BP 124/62 | HR 88 | Temp 98.7°F | Resp 20

## 2022-07-16 DIAGNOSIS — B2790 Infectious mononucleosis, unspecified without complication: Secondary | ICD-10-CM

## 2022-07-16 DIAGNOSIS — D500 Iron deficiency anemia secondary to blood loss (chronic): Secondary | ICD-10-CM

## 2022-07-16 DIAGNOSIS — R599 Enlarged lymph nodes, unspecified: Secondary | ICD-10-CM | POA: Diagnosis not present

## 2022-07-16 DIAGNOSIS — Z7189 Other specified counseling: Secondary | ICD-10-CM | POA: Insufficient documentation

## 2022-07-16 DIAGNOSIS — U099 Post covid-19 condition, unspecified: Secondary | ICD-10-CM | POA: Diagnosis not present

## 2022-07-16 DIAGNOSIS — M60005 Infective myositis, unspecified leg: Secondary | ICD-10-CM | POA: Diagnosis not present

## 2022-07-16 LAB — CMV DNA BY PCR, QUALITATIVE: CMV DNA, Qual PCR: NEGATIVE

## 2022-07-16 MED ORDER — SODIUM CHLORIDE 0.9 % IV SOLN: Freq: Once | INTRAVENOUS | Status: AC

## 2022-07-16 MED ORDER — SODIUM CHLORIDE 0.9 % IV SOLN
510.0000 mg | Freq: Once | INTRAVENOUS | Status: AC
  Administered 2022-07-16: 510 mg via INTRAVENOUS
  Filled 2022-07-16: qty 17

## 2022-07-16 MED ORDER — LORATADINE 10 MG PO TABS
10.0000 mg | ORAL_TABLET | Freq: Every day | ORAL | Status: DC
  Administered 2022-07-16: 10 mg via ORAL
  Filled 2022-07-16: qty 1

## 2022-07-16 MED ORDER — ACETAMINOPHEN 325 MG PO TABS
650.0000 mg | ORAL_TABLET | Freq: Once | ORAL | Status: AC
  Administered 2022-07-16: 650 mg via ORAL
  Filled 2022-07-16: qty 2

## 2022-07-16 NOTE — Telephone Encounter (Signed)
07/16/22 next appt scheduled and confirmed with patient 

## 2022-07-16 NOTE — Patient Instructions (Signed)
Iron Deficiency Anemia, Adult  Iron deficiency anemia is a condition in which the concentration of red blood cells or hemoglobin in the blood is below normal because of too little iron. Hemoglobin is a substance in red blood cells that carries oxygen to the body's tissues. When the concentration of red blood cells or hemoglobin is too low, not enough oxygen reaches these tissues. Iron deficiency anemia is usually long-lasting, and it develops over time. It may or may not cause symptoms. It is a common type of anemia. What are the causes? This condition may be caused by: Not enough iron in the diet. Abnormal absorption in the gut. Blood loss. What increases the risk? You are more likely to develop this condition if you get menstrual periods (menstruate) or are pregnant. What are the signs or symptoms? Symptoms of this condition may include: Pale skin, lips, and nail beds. Weakness, dizziness, and getting tired easily. Shortness of breath when moving or exercising. Cold hands or feet. Mild anemia may not cause any symptoms. How is this diagnosed? This condition is diagnosed based on: Your medical history. A physical exam. Blood tests. How is this treated? This condition is treated by correcting the cause of your iron deficiency. Treatment may involve: Adding iron-rich foods to your diet. Taking iron supplements. If you are pregnant or breastfeeding, you may need to take extra iron because your normal diet usually does not provide the amount of iron that you need. Increasing vitamin C intake. Vitamin C helps your body absorb iron. Your health care provider may recommend that you take iron supplements along with a glass of orange juice or a vitamin C supplement. Medicines to make heavy menstrual flow lighter. Surgery or additional testing procedures to determine the cause of your anemia. You may need repeat blood tests to determine whether treatment is working. If the treatment does not  seem to be working, you may need more tests. Follow these instructions at home: Medicines Take over-the-counter and prescription medicines only as told by your health care provider. This includes iron supplements and vitamins. This is important because too much iron can be harmful. For the best iron absorption, you should take iron supplements when your stomach is empty. If you cannot tolerate them on an empty stomach, you may need to take them with food. Do not drink milk or take antacids at the same time as your iron supplements. Milk and antacids may interfere with how your body absorbs iron. Iron supplements may turn stool (feces) a darker color and it may appear black. If you cannot tolerate taking iron supplements by mouth, talk with your health care provider about taking them through an IV or through an injection into a muscle. Eating and drinking Talk with your health care provider before changing your diet. Your provider may recommend that you eat foods that contain a lot of iron, such as: Liver. Low-fat (lean) beef. Breads and cereals that have iron added to them (are fortified). Eggs. Dried fruit. Dark green, leafy vegetables. To help your body use the iron from iron-rich foods, eat those foods at the same time as fresh fruits and vegetables that are high in vitamin C. Foods that are high in vitamin C include: Oranges. Peppers. Tomatoes. Mangoes. Managing constipation If you are taking an iron supplement, it may cause constipation. To prevent or treat constipation, you may need to: Drink enough fluid to keep your urine pale yellow. Take over-the-counter or prescription medicines. Eat foods that are high in fiber, such   as beans, whole grains, and fresh fruits and vegetables. Limit foods that are high in fat and processed sugars, such as fried or sweet foods. General instructions Return to your normal activities as told by your health care provider. Ask your health care provider  what activities are safe for you. Keep all follow-up visits. Contact a health care provider if: You feel nauseous or you vomit. You feel weak. You become light-headed when getting up from a sitting or lying down position. You have unexplained sweating. You develop symptoms of constipation. You have a heaviness in your chest. You have trouble breathing with physical activity. Get help right away if: You faint. If this happens, do not drive yourself to the hospital. You have an irregular or rapid heartbeat. Summary Iron deficiency anemia is a condition in which the concentration of red blood cells or hemoglobin in the blood is below normal because of too little iron. This condition is treated by correcting the cause of your iron deficiency. Take over-the-counter and prescription medicines only as told by your health care provider. This includes iron supplements and vitamins. To help your body use the iron from iron-rich foods, eat those foods at the same time as fresh fruits and vegetables that are high in vitamin C. Seek medical help if you have signs or symptoms of worsening anemia. This information is not intended to replace advice given to you by your health care provider. Make sure you discuss any questions you have with your health care provider. Document Revised: 11/05/2021 Document Reviewed: 11/05/2021 [employer] Patient Education  2023 [employer] Iron-Rich Diet  Iron is a mineral that helps your body produce hemoglobin. Hemoglobin is a protein in red blood cells that carries oxygen to your body's tissues. Eating too little iron may cause you to feel weak and tired, and it can increase your risk of infection. Iron is naturally found in many foods, and many foods have iron added to them (are iron-fortified). You may need to follow an iron-rich diet if you do not have enough iron in your body due to certain medical conditions. The amount of iron that you need each day depends on your  age, your sex, and any medical conditions you have. Follow instructions from your health care provider or a dietitian about how much iron you should eat each day. What are tips for following this plan? Reading food labels Check food labels to see how many milligrams (mg) of iron are in each serving. Cooking Cook foods in pots and pans that are made from iron. Take these steps to make it easier for your body to absorb iron from certain foods: Soak beans overnight before cooking. Soak whole grains overnight and drain them before using. Ferment flours before baking, such as by using yeast in bread dough. Meal planning When you eat foods that contain iron, you should eat them with foods that are high in vitamin C. These include oranges, peppers, tomatoes, potatoes, and mangoes. Vitamin C helps your body absorb iron. Certain foods and drinks prevent your body from absorbing iron properly. Avoid eating these foods in the same meal as iron-rich foods or with iron supplements. These foods include: Coffee, black tea, and red wine. Milk, dairy products, and foods that are high in calcium. Beans and soybeans. Whole grains. General information Take iron supplements only as told by your health care provider. An overdose of iron can be life-threatening. If you were prescribed iron supplements, take them with orange juice or a vitamin C   supplement. When you eat iron-fortified foods or take an iron supplement, you should also eat foods that naturally contain iron, such as meat, poultry, and fish. Eating naturally iron-rich foods helps your body absorb the iron that is added to other foods or contained in a supplement. Iron from animal sources is better absorbed than iron from plant sources. What foods should I eat? Fruits Prunes. Raisins. Eat fruits high in vitamin C, such as oranges, grapefruits, and strawberries, with iron-rich foods. Vegetables Spinach (cooked). Green peas. Broccoli. Fermented  vegetables. Eat vegetables high in vitamin C, such as leafy greens, potatoes, bell peppers, and tomatoes, with iron-rich foods. Grains Iron-fortified breakfast cereal. Iron-fortified whole-wheat bread. Enriched rice. Sprouted grains. Meats and other proteins Beef liver. Beef. Turkey. Chicken. Oysters. Shrimp. Tuna. Sardines. Chickpeas. Nuts. Tofu. Pumpkin seeds. Beverages Tomato juice. Fresh orange juice. Prune juice. Hibiscus tea. Iron-fortified instant breakfast shakes. Sweets and desserts Blackstrap molasses. Seasonings and condiments Tahini. Fermented soy sauce. Other foods Wheat germ. The items listed above may not be a complete list of recommended foods and beverages. Contact a dietitian for more information. What foods should I limit? These are foods that should be limited while eating iron-rich foods as they can reduce the absorption of iron in your body. Grains Whole grains. Bran cereal. Bran flour. Meats and other proteins Soybeans. Products made from soy protein. Black beans. Lentils. Mung beans. Split peas. Dairy Milk. Cream. Cheese. Yogurt. Cottage cheese. Beverages Coffee. Black tea. Red wine. Sweets and desserts Cocoa. Chocolate. Ice cream. Seasonings and condiments Basil. Oregano. Large amounts of parsley. The items listed above may not be a complete list of foods and beverages you should limit. Contact a dietitian for more information. Summary Iron is a mineral that helps your body produce hemoglobin. Hemoglobin is a protein in red blood cells that carries oxygen to your body's tissues. Iron is naturally found in many foods, and many foods have iron added to them (are iron-fortified). When you eat foods that contain iron, you should eat them with foods that are high in vitamin C. Vitamin C helps your body absorb iron. Certain foods and drinks prevent your body from absorbing iron properly, such as whole grains and dairy products. You should avoid eating these foods  in the same meal as iron-rich foods or with iron supplements. This information is not intended to replace advice given to you by your health care provider. Make sure you discuss any questions you have with your health care provider. Document Revised: 09/09/2020 Document Reviewed: 09/09/2020 [employer] Patient Education  2023 [employer]  

## 2022-07-18 LAB — CK ISOENZYMES
CK-BB: 0 %
CK-MB: 0 % (ref 0–3)
CK-MM: 100 % (ref 97–100)
Creatine Kinase-Total: 244 U/L — ABNORMAL HIGH (ref 32–182)
Macro Type 1: 0 %
Macro Type 2: 0 %

## 2022-07-20 LAB — MULTIPLE MYELOMA PANEL, SERUM
Albumin SerPl Elph-Mcnc: 3.7 g/dL (ref 2.9–4.4)
Albumin/Glob SerPl: 0.8 (ref 0.7–1.7)
Alpha 1: 0.2 g/dL (ref 0.0–0.4)
Alpha2 Glob SerPl Elph-Mcnc: 0.8 g/dL (ref 0.4–1.0)
B-Globulin SerPl Elph-Mcnc: 1.3 g/dL (ref 0.7–1.3)
Gamma Glob SerPl Elph-Mcnc: 2.8 g/dL — ABNORMAL HIGH (ref 0.4–1.8)
Globulin, Total: 5.1 g/dL — ABNORMAL HIGH (ref 2.2–3.9)
IgA: 399 mg/dL — ABNORMAL HIGH (ref 87–352)
IgG (Immunoglobin G), Serum: 2965 mg/dL — ABNORMAL HIGH (ref 586–1602)
IgM (Immunoglobulin M), Srm: 125 mg/dL (ref 26–217)
Total Protein ELP: 8.8 g/dL — ABNORMAL HIGH (ref 6.0–8.5)

## 2022-07-21 LAB — COPPER, SERUM: Copper: 136 ug/dL (ref 80–158)

## 2022-07-21 LAB — ZINC: Zinc: 50 ug/dL (ref 44–115)

## 2022-07-22 MED FILL — Ferumoxytol Inj 510 MG/17ML (30 MG/ML) (Elemental Fe): INTRAVENOUS | Qty: 17 | Status: AC

## 2022-07-22 NOTE — Progress Notes (Signed)
[facility] Follow up Visit:  Patient Care Team: Carol Ada, MD as PCP - General (Family Medicine)  CHIEF COMPLAINTS/PURPOSE OF CONSULTATION:  Oncology History   No history exists.    HISTORY OF PRESENTING ILLNESS: Tara Farrell 41 y.o. female is here because of anemia, myalgias, adenopathy.    September 09/2013: WBC 8.5 hemoglobin 11.4 MCV 69 platelet count 242   June 18, 2022:  Patient had COVID 19 in August 2023.  Her symptoms included fatigue, generalized myalgias, sore throat, anorexia, diarrhea, HA, anosmia, decreased sense of taste  She had a lot of nasal congestion, SOB.  She was treated with Paxlovid.  Sense of smell is coming back.  Myalgias improved after Paxlovid but she developed new myalgias involving her legs which began in September 2023.    Saw PA at PCP's office for evaluation of bilateral hip and leg pain.  Left hip pain began a week prior.  Patient was instructed to begin meloxicam and methocarbamol without relief of symptoms.  June 20 2022:  Presented to urgent care with hip and leg pain.  Treated with steroid injections to both hips for presumed bursitis without benefit.  June 21 2022:  Began steroid dose pack without benefit.    July 10, 2022:  Seen at [facility].   MRI of the right hip showed a small hip joint effusion.  No muscle or tendon signal abnormalities.  Multiple and large internal and external iliac lymph nodes the largest measuring up to 1.6 cm in short axis and a 3.2 cm right ovarian cyst.   Presented to [facility] health emergency room with shortness of breath, dizziness, lightheadedness, WBC 5.3 hemoglobin 7.2 MCV 54 platelet count 342; 74 segs 16 lymphs 8 monos 2 eos.  Teardrops polychromasia and target cells noted.  Reticulocyte count 1.3% INR 1.1   CMP notable for sodium 134 potassium 3.2 creatinine 0.44 T. bili 0.4 B12 7450 folate 6.9 ferritin 3  Patient was placed on iron iron and potassium.  She was  referred to primary care physician  Other laboratories notable for CRP of 6 (upper limit of normal 10) CPK 502 (upper limit of normal 165)  July 13 2022:  [facility]  Patient is G2 P1011 Menopause not reached.  Menses occur monthly and regular and last 5 to 6 days.  Bleeding is moderate.  Does not have bleeding between periods.    Does not pass clots.  Last pregnancy was delivered by NSVD section in 2014.  No postpartum hemorrhage.    Patient does not have history of uterine fibroids/uterine abnormalities.  Has taken oral iron in the past.  Has not received IV iron/ required PRBC's in the past.  Has tolerated oral iron owing to GI intolerance.   Has a normal diet.  No hematochezia, melena, hemoptysis, hematuria.   No history of intra-articular or soft tissue bleeding  No history of abnormal bleeding in family members Patient has symptoms of fatigue, pallor,  DOE, decreased performance status.  Patient has pica to ice.    Social:  Married.  Works for [employer].  Tobacco none.  EtOH occasionally  [facility] Mother alive 57 Graves Disease, HTN Father alive 52 none that patient is aware of Brother alive 42 well Sister alive 22 well  Coombs test (IgG +/complement negative) no specific antibody demonstrating eluate.  Haptoglobin 213  Hemoglobin electrophoresis normal SPEP with IEP pending.  Serum free kappa 25.6, lambda 18.3 with a kappa lambda 1.40  ANA panel  negative.  RF negative.  C3 169 (elevated) C4 11 enthesis lower limit of normal 12) Lyme disease panel negative.  RPR negative.  CMV DNA PCR negative EBV DNA 52 by PCR.  Hepatitis ABC serologies negative.  HIV negative LDH 157 myoglobin 42 TSH/free T40.633/1.08. Pregnancy test negative  ANCA and CK in process  July 14 2022: CT chest abdomen pelvis.  Prominent/mildly enlarged lymph nodes above and below the diaphragm nonspecific.  No splenomegaly.  Mild symmetrical esophageal wall thickening.  Scattered colonic  diverticulosis without findings of acute diverticulitis.  Uterine leiomyomas.  3.1 cm right ovarian cyst considered benign  July 16 2022: Scheduled follow up for management of anemia and myositis.  Reviewed results of labs and CT scans with patient and her husband. She feels better overall.  Arthralgias and myalgias, fatigue have improved with Prednisone 50 mg daily (last dose yesterday) and Ibuprofen 800 mg tid.  She is taking a PPI and famotidine for GERD.  Sleeping better.  To begin IV iron today    July 23 2022:  Scheduled follow up for management of anemia and myositis.  Patient called on October 7 stating that she having severe myalgias having stopped prednisone on October 4.  I called in script for prednisone 50 mg daily which she started on July 19 2022.  Feels much better today.  Myalgias down to a 2/10 with help of tylenol and biofreze.  Sleeping off and on at night but to myalgias and arthralgias of hips.   Received second dose of IV iron today.   Pica improving; fatigue better.  Patient will decrease prednisone to 40 mg on July 26 2022 Will check Ferritin and CK today   Review of Systems  Constitutional:  Positive for fatigue. Negative for chills, fever and unexpected weight change.  HENT:   Negative for mouth sores, nosebleeds, sore throat and trouble swallowing.   Eyes:  Negative for eye problems and icterus.  Respiratory:  Negative for chest tightness, cough, hemoptysis and shortness of breath.   Cardiovascular:  Negative for chest pain, leg swelling and palpitations.  Gastrointestinal:  Negative for abdominal pain, blood in stool, constipation, diarrhea and nausea.  Genitourinary:  Negative for difficulty urinating, dysuria and hematuria.   Musculoskeletal:  Positive for arthralgias and myalgias. Negative for gait problem.       Occasional lumbar pain Arthralgias and myalgias improving  Neurological:  Negative for gait problem, headaches, light-headedness and numbness.        Occasional dizziness  Hematological:  Negative for adenopathy. Does not bruise/bleed easily.  Psychiatric/Behavioral:  Negative for depression.        Awakes in the middle of the night Sleep improving    MEDICAL HISTORY: Past Medical History:  Diagnosis Date   GERD (gastroesophageal reflux disease)    Medical history non-contributory    Preterm labor    18 week SAB    SURGICAL HISTORY: Past Surgical History:  Procedure Laterality Date   CERVICAL CERCLAGE N/A 03/21/2013   Procedure: CERCLAGE CERVICAL;  Surgeon: Lenoard Aden, MD;  Location: [facility];  Service: Gynecology;  Laterality: N/A;  EDD: 09/25/13   DIAGNOSTIC LAPAROSCOPY     DILATION AND CURETTAGE OF UTERUS      SOCIAL HISTORY: Social History   Socioeconomic History   Marital status: Married    Spouse name: Molli Hazard   Number of children: 1   Years of education: 14   Highest education level: Some college, no degree  Occupational History   Not on  file  Tobacco Use   Smoking status: Never   Smokeless tobacco: Never  Vaping Use   Vaping Use: Never used  Substance and Sexual Activity   Alcohol use: No   Drug use: No   Sexual activity: Not Currently    Birth control/protection: None  Other Topics Concern   Not on file  Social History Narrative   Not on file   Social Determinants of Health   Financial Resource Strain: Not on file  Food Insecurity: Not on file  Transportation Needs: Not on file  Physical Activity: Not on file  Stress: Not on file  Social Connections: Not on file  Intimate Partner Violence: Not on file    FAMILY HISTORY Family History  Problem Relation Age of Onset   Healthy Mother    Healthy Father     ALLERGIES:  has No Known Allergies.  MEDICATIONS:  Current Outpatient Medications  Medication Sig Dispense Refill   albuterol (PROVENTIL HFA) 108 (90 Base) MCG/ACT inhaler Inhale 1-2 puffs into the lungs every 6 (six) hours as needed for wheezing or shortness of breath. 3  each 3   famotidine (PEPCID) 40 MG tablet Take 40 mg by mouth daily.     ferrous sulfate 325 (65 FE) MG tablet Take one tablet a day for five days. If you are tolerating it, increase to twice a day. If after another five days you are still tolerating it, increase to three times a day. At any point if you develop intolerable side effects, go back to the lower dose. 90 tablet 0   montelukast (SINGULAIR) 10 MG tablet Take 1 tablet (10 mg total) by mouth at bedtime. 30 tablet 11   omeprazole (PRILOSEC) 20 MG capsule Take 20 mg by mouth daily.     potassium chloride SA (KLOR-CON M) 20 MEQ tablet Take 1 tablet (20 mEq total) by mouth 2 (two) times daily. 10 tablet 0   SYMBICORT 80-4.5 MCG/ACT inhaler Inhale 2 puffs into the lungs in the morning and at bedtime.     No current facility-administered medications for this visit.    PHYSICAL EXAMINATION:  ECOG PERFORMANCE STATUS: 1 - Symptomatic but completely ambulatory   There were no vitals filed for this visit.   There were no vitals filed for this visit.    Physical Exam Vitals and nursing note reviewed.  Constitutional:      General: She is not in acute distress.    Appearance: Normal appearance. She is not ill-appearing, toxic-appearing or diaphoretic.     Comments: Here alone.  Comfortable appearing.  Not rubbing her thighs  HENT:     Head: Normocephalic and atraumatic.     Right Ear: External ear normal.     Left Ear: External ear normal.     Nose: Nose normal. No congestion or rhinorrhea.  Eyes:     General: No scleral icterus.    Extraocular Movements: Extraocular movements intact.     Conjunctiva/sclera: Conjunctivae normal.     Pupils: Pupils are equal, round, and reactive to light.  Cardiovascular:     Rate and Rhythm: Normal rate and regular rhythm.     Heart sounds: Normal heart sounds. No murmur heard.    No friction rub. No gallop.  Pulmonary:     Effort: Pulmonary effort is normal. No respiratory distress.      Breath sounds: Normal breath sounds. No stridor. No wheezing, rhonchi or rales.  Chest:     Chest wall: No tenderness.  Abdominal:  General: Bowel sounds are normal. There is no distension.     Palpations: Abdomen is soft. There is no mass.     Tenderness: There is no abdominal tenderness. There is no rebound.  Musculoskeletal:        General: No swelling, tenderness or deformity.     Cervical back: Normal range of motion and neck supple. No rigidity or tenderness.  Lymphadenopathy:     Head:     Right side of head: No submental, submandibular, tonsillar, preauricular, posterior auricular or occipital adenopathy.     Left side of head: No submental, submandibular, tonsillar, preauricular, posterior auricular or occipital adenopathy.     Cervical: No cervical adenopathy.     Right cervical: No superficial, deep or posterior cervical adenopathy.    Left cervical: No superficial, deep or posterior cervical adenopathy.     Upper Body:     Right upper body: No supraclavicular, axillary, pectoral or epitrochlear adenopathy.     Left upper body: No supraclavicular, axillary, pectoral or epitrochlear adenopathy.  Skin:    General: Skin is warm.     Coloration: Skin is not jaundiced or pale.     Findings: No bruising or erythema.  Neurological:     General: No focal deficit present.     Mental Status: She is alert and oriented to person, place, and time. Mental status is at baseline.     Cranial Nerves: No cranial nerve deficit.  Psychiatric:        Mood and Affect: Mood normal.        Behavior: Behavior normal.        Thought Content: Thought content normal.        Judgment: Judgment normal.     LABORATORY DATA: I have personally reviewed the data as listed:  Office Visit on 07/13/2022  Component Date Value Ref Range Status   Myoglobin 07/13/2022 42  25 - 58 ng/mL Final   Comment: (NOTE) Performed At: [facility] [address] [place], [place] [id number] Jolene Schimke MD WU:[id number]    CMV DNA, Qual PCR 07/13/2022 Negative  Negative Final   Comment: (NOTE) No Cytomegalovirus DNA Detected. This test was developed and its performance characteristics determined by [facility].  It has not been cleared or approved by the Food and Drug Administration.  The FDA has determined that such clearance or approval is not necessary. Performed At: [facility] [address] [place], [place] [id number] Jolene Schimke MD VH:[id number]    Hepatitis B Surface Ag 07/13/2022 NON REACTIVE  NON REACTIVE Final   HCV Ab 07/13/2022 NON REACTIVE  NON REACTIVE Final   Comment: (NOTE) Nonreactive HCV antibody screen is consistent with no HCV infections,  unless recent infection is suspected or other evidence exists to indicate HCV infection.     Hep A IgM 07/13/2022 NON REACTIVE  NON REACTIVE Final   Hep B C IgM 07/13/2022 NON REACTIVE  NON REACTIVE Final   Performed at [facility], [address], [place], [place] [address]   HIV Screen 4th Generation wRfx 07/13/2022 Non Reactive  Non Reactive Final   Performed at [facility], [address] [place], [place] [address]   RPR Ser Ql 07/13/2022 NON REACTIVE  NON REACTIVE Final   Performed at [facility], [address] [place], [place] [address]   Lyme Total Antibody EIA 07/13/2022 Negative  Negative Final   Comment: (NOTE) Lyme antibodies not detected. Reflex testing is not indicated. No  laboratory evidence of infection with B. burgdorferi (Lyme disease). Negative results may occur in patients recently infected (less than or equal to 14 days) with B. burgdorferi.  If recent infection is suspected, repeat testing on a new sample collected in 7 to 14 days is recommended. Performed At: [facility] [address] [place], [place] [id number] Jolene Schimke MD KP:[id number]    EBV DNA QN by PCR 07/13/2022 52  Negative IU/mL Final   Comment: The linear range of this assay is 35    100,000,000 IU/mL    log10 EBV DNA Qn PCR 07/13/2022 1.716  log10 IU/mL Final   Comment: (NOTE) Performed At: [facility] [address] [place], [place] [id number] Jolene Schimke MD WH:[id number]    Free T4 07/13/2022 1.08  0.61 - 1.12 ng/dL Final   Comment: (NOTE) Biotin ingestion may interfere with free T4 tests. If the results are inconsistent with the TSH level, previous test results, or the clinical presentation, then consider biotin interference. If needed, order repeat testing after stopping biotin. Performed at [facility], [address], [place], [place] [address]    TSH 07/13/2022 0.633  0.350 - 4.500 uIU/mL Final   Comment: Performed by a 3rd Generation assay with a functional sensitivity of <=0.01 uIU/mL. Performed at [facility], [address], [place], [place] [address]   Appointment on 07/13/2022  Component Date Value Ref Range Status   Haptoglobin 07/13/2022 213  33 - 278 mg/dL Final   Comment: (NOTE) Performed At: [facility] [address] [place], [place] [id number] Jolene Schimke MD QZ:[id number]    Zinc 07/13/2022 50  44 - 115 ug/dL Final   Comment: (NOTE) This test was developed and its performance characteristics determined by [facility]. It has not been cleared or approved by the Food and Drug Administration.                                Detection Limit = 5 Performed At: [facility] [address] [place], [place] [id number] Jolene Schimke MD SL:[id number]    Hgb F 07/13/2022 0.0  0.0 - 2.0 % Final   Hgb A 07/13/2022 97.8  96.4 - 98.8 % Final   Hgb A2 07/13/2022 2.2  1.8 - 3.2 % Final   Hgb S 07/13/2022 0.0  0.0 % Final   Interpretation, Hgb Fract 07/13/2022 Comment   Final   Comment: (NOTE) Normal hemoglobin present; no hemoglobin variant or beta thalassemia identified. Note: Alpha thalassemia may not be detected by the Hgb Fractionation Cascade panel. If alpha thalassemia is  suspected, [facility] offers Alpha-Thalassemia DNA Analysis [phone]). Performed At: [facility] [address] [place], [place] [id number] Jolene Schimke MD LA:[id number]    Kappa free light chain 07/13/2022 25.6 (H)  3.3 - 19.4 mg/L Final   Lambda free light chains 07/13/2022 18.3  5.7 - 26.3 mg/L Final   Kappa, lambda light chain ratio 07/13/2022 1.40  0.26 - 1.65 Final   Comment: (NOTE) Performed At: [facility] [address] [place], [place] [id number] Jolene Schimke MD BB:[id number]    IgG (Immunoglobin G), Serum 07/13/2022 2,965 (H)  586 - 1,602 mg/dL Corrected   CORRECTED ON 10/09 AT 1136: PREVIOUSLY REPORTED AS 3056 Reference range: 586 to 1602   IgA 07/13/2022 399 (H)  87 - 352 mg/dL Corrected   CORRECTED ON 10/09 AT 1136: PREVIOUSLY REPORTED AS 392 Reference range: 87 to 352  IgM (Immunoglobulin M), Srm 07/13/2022 125  26 - 217 mg/dL Corrected   CORRECTED ON 10/09 AT 1136: PREVIOUSLY REPORTED AS 120 Reference range: 26 to 217   Total Protein ELP 07/13/2022 8.8 (H)  6.0 - 8.5 g/dL Corrected   Albumin SerPl Elph-Mcnc 07/13/2022 3.7  2.9 - 4.4 g/dL Corrected   CORRECTED ON 10/09 AT 1136: PREVIOUSLY REPORTED AS 3.6 Reference range: 2.9 to 4.4   Alpha 1 07/13/2022 0.2  0.0 - 0.4 g/dL Corrected   Alpha2 Glob SerPl Elph-Mcnc 07/13/2022 0.8  0.4 - 1.0 g/dL Corrected   B-Globulin SerPl Elph-Mcnc 07/13/2022 1.3  0.7 - 1.3 g/dL Corrected   CORRECTED ON 10/09 AT 1136: PREVIOUSLY REPORTED AS 1.4 Reference range: 0.7 to 1.3   Gamma Glob SerPl Elph-Mcnc 07/13/2022 2.8 (H)  0.4 - 1.8 g/dL Corrected   M Protein SerPl Elph-Mcnc 07/13/2022 Not Observed  Not Observed g/dL Corrected   Globulin, Total 07/13/2022 5.1 (H)  2.2 - 3.9 g/dL Corrected   CORRECTED ON 10/09 AT 1136: PREVIOUSLY REPORTED AS 5.2 Reference range: 2.2 to 3.9   Albumin/Glob SerPl 07/13/2022 0.8  0.7 - 1.7 Corrected   CORRECTED ON 10/09 AT 1136: PREVIOUSLY REPORTED AS 0.7 Reference range: 0.7 to 1.7   IFE 1  07/13/2022 Comment (A)   Corrected   Polyclonal increase detected in one or more immunoglobulins.   Please Note 07/13/2022 Comment   Corrected   Comment: (NOTE) Protein electrophoresis scan will follow via computer, mail, or courier delivery. Performed At: [facility] [place] [address] [place], [place] [id number] Jolene SchimkeNagendra Sanjai MD QI:[id number]:[phone]    CK-MM 07/13/2022 100  97 - 100 % Final   CK-MB 07/13/2022 0  0 - 3 % Final   CK-BB 07/13/2022 0  0 % Final   Comment: (NOTE) Performed At: [facility] [place] [address] [place], [place] [id number] Jolene SchimkeNagendra Sanjai MD OZ:[id number]:[phone]    Creatine Kinase-Total 07/13/2022 244 (H)  32 - 182 U/L Corrected   Macro Type 1 07/13/2022 0  Not Observed % Final   Macro Type 2 07/13/2022 0  Not Observed % Final   Rhuematoid fact SerPl-aCnc 07/13/2022 <10.0  <14.0 IU/mL Final   Comment: (NOTE) Performed At: [facility] [place] [address] [place], [place] [id number] Jolene SchimkeNagendra Sanjai MD IE:[id number]:[phone]    LDH 07/13/2022 157  98 - 192 U/L Final   Performed at [facility], [address] [place], [place] [address]   C3 Complement 07/13/2022 169 (H)  82 - 167 mg/dL Final   Complement C4, Body Fluid 07/13/2022 11 (L)  12 - 38 mg/dL Final   Comment: (NOTE) Performed At: [facility] [place] [address] [place], [place] [id number] Jolene SchimkeNagendra Sanjai MD TF:[id number]:[phone]    ds DNA Ab 07/13/2022 <1  0 - 9 IU/mL Final   Comment: (NOTE)                                   Negative      <5                                   Equivocal  5 - 9                                   Positive      >  9    Ribonucleic Protein 07/13/2022 0.5  0.0 - 0.9 AI Final   ENA SM Ab Ser-aCnc 07/13/2022 <0.2  0.0 - 0.9 AI Final   Scleroderma (Scl-70) (ENA) Antibod* 07/13/2022 <0.2  0.0 - 0.9 AI Final   SSA (Ro) (ENA) Antibody, IgG 07/13/2022 <0.2  0.0 - 0.9 AI Final   SSB (La) (ENA) Antibody, IgG 07/13/2022 <0.2  0.0 - 0.9 AI Final   Chromatin Ab SerPl-aCnc  07/13/2022 0.2  0.0 - 0.9 AI Final   Anti JO-1 07/13/2022 <0.2  0.0 - 0.9 AI Final   Centromere Ab Screen 07/13/2022 <0.2  0.0 - 0.9 AI Final   See below: 07/13/2022 Comment   Final   Comment: (NOTE) Pattern              Potential Disease Association -------------  --------------------------------------------- Homogeneous    Systemic Lupus Erythematosus, Drug Induced               Systemic Lupus Erythematosus, Chronic               Autoimmune hepatitis, Juvenile Idiopathic               Arthritis -------------  --------------------------------------------- Speckled       Sjogren Syndrome, Systemic Lupus               Erythematosus, Subacute Cutaneous Lupus,               Neonatal Lupus, Congenital Heart Block,               Mixed Connective Tissue Disease,               Scleroderma-diffuse, Scleroderma-Autoimmune               Myositis Overlap Syndrome, Systemic Lupus               Erythematosus-Scleroderma-Autoimmune               Myositis Overlap Syndrome, Systemic               Autoimmune Rheumatic Disease,               Undifferentiated Connective Tissue Disease -------------  --------------------------------------------- Nucleolar      Systemic Sclero                          sis, Scleroderma-Autoimmune               Myositis Overlap Syndrome, Sjogren               Syndrome, Raynaud phenomenon, Pulmonary               Arterial Hypertension, Systemic Autoimmune               Rheumatic Disease, Cancer -------------  --------------------------------------------- Centromere     Scleroderma-CREST, Limited Cutaneous SSc,               Raynaud's Phenomenon, Primary Biliary               Cholangitis -------------  --------------------------------------------- Nuclear Dot    Primary Biliary Cholangitis -------------  --------------------------------------------- Nuclear        Primary Biliary Cholangitis, Autoimmune Membrane       Hepatitis/Liver disease, Systemic Autoimmune                Rheumatic Disease, Autoimmune Cytopenias,  Linear Scleroderma, Antiphospholipid Syndrome -------------  --------------------------------------------- Performed At: [facility] [address] [place], [place] [id number] Jolene Schimke MD Ph:[phone]                          [phone]    Preg, Serum 07/13/2022 NEGATIVE  NEGATIVE Final   Comment:        THE SENSITIVITY OF THIS METHODOLOGY IS >10 mIU/mL. Performed at [facility], [address], [place], [place] [address]    DAT, complement 07/13/2022 NEG   Final   DAT, IgG 07/13/2022 POS   Final   Antibody ID,T [place] 07/13/2022    Final                   Value:NO SPECIFIC ANTIBODY DEMONSTRATED IN ELUATE Performed at [facility], [address] [place], [place] [address]    Copper 07/13/2022 136  80 - 158 ug/dL Final   Comment: (NOTE) This test was developed and its performance characteristics determined by [facility]. It has not been cleared or approved by the Food and Drug Administration.                                Detection Limit = 5 Performed At: [facility] [place] [address] [place], [place] [id number] Jolene Schimke MD GQ:[id number]   Admission on 07/10/2022, Discharged on 07/11/2022  Component Date Value Ref Range Status   WBC 07/10/2022 5.3  4.0 - 10.5 K/uL Final   RBC 07/10/2022 5.02  3.87 - 5.11 MIL/uL Final   Hemoglobin 07/10/2022 7.2 (L)  12.0 - 15.0 g/dL Final   Comment: Reticulocyte Hemoglobin testing may be clinically indicated, consider ordering this additional test [id number]    HCT 07/10/2022 27.1 (L)  36.0 - 46.0 % Final   MCV 07/10/2022 54.0 (L)  80.0 - 100.0 fL Final   MCH 07/10/2022 14.3 (L)  26.0 - 34.0 pg Final   MCHC 07/10/2022 26.6 (L)  30.0 - 36.0 g/dL Final   RDW 99/83/3825 24.0 (H)  11.5 - 15.5 % Final   Platelets 07/10/2022 342  150 - 400 K/uL Final   nRBC 07/10/2022 0.0  0.0 - 0.2 % Final   Neutrophils Relative % 07/10/2022 74   % Final   Neutro Abs 07/10/2022 4.0  1.7 - 7.7 K/uL Final   Lymphocytes Relative 07/10/2022 16  % Final   Lymphs Abs 07/10/2022 0.8  0.7 - 4.0 K/uL Final   Monocytes Relative 07/10/2022 8  % Final   Monocytes Absolute 07/10/2022 0.4  0.1 - 1.0 K/uL Final   Eosinophils Relative 07/10/2022 2  % Final   Eosinophils Absolute 07/10/2022 0.1  0.0 - 0.5 K/uL Final   Basophils Relative 07/10/2022 0  % Final   Basophils Absolute 07/10/2022 0.0  0.0 - 0.1 K/uL Final   Immature Granulocytes 07/10/2022 0  % Final   Abs Immature Granulocytes 07/10/2022 0.01  0.00 - 0.07 K/uL Final   Tear Drop Cells 07/10/2022 PRESENT   Final   Polychromasia 07/10/2022 PRESENT   Final   Target Cells 07/10/2022 PRESENT   Final   Ovalocytes 07/10/2022 PRESENT   Final   Performed at [facility], [address], [place], [place] [address]   ABO/RH(D) 07/10/2022 A POS   Final   Antibody Screen 07/10/2022 POS   Final   Sample Expiration 07/10/2022 07/13/2022,2359   Final  Antibody Identification 07/10/2022 NO CLINICALLY SIGNIFICANT ANTIBODY IDENTIFIED   Final   DAT, IgG 07/10/2022    Final                   Value:NEG Performed at [facility], [address], [place], [place] [address]    Prothrombin Time 07/10/2022 13.7  11.4 - 15.2 seconds Final   INR 07/10/2022 1.1  0.8 - 1.2 Final   Comment: (NOTE) INR goal varies based on device and disease states. Performed at [facility], [address], [place], [place] [address]    Sodium 07/10/2022 134 (L)  135 - 145 mmol/L Final   Potassium 07/10/2022 3.2 (L)  3.5 - 5.1 mmol/L Final   Chloride 07/10/2022 104  98 - 111 mmol/L Final   CO2 07/10/2022 23  22 - 32 mmol/L Final   Glucose, Bld 07/10/2022 93  70 - 99 mg/dL Final   Glucose reference range applies only to samples taken after fasting for at least 8 hours.   BUN 07/10/2022 14  6 - 20 mg/dL Final   Creatinine, Ser 07/10/2022 0.44  0.44 - 1.00 mg/dL Final    Calcium 09/81/1914 9.1  8.9 - 10.3 mg/dL Final   Total Protein 78/29/5621 8.9 (H)  6.5 - 8.1 g/dL Final   Albumin 30/86/5784 3.6  3.5 - 5.0 g/dL Final   AST 69/62/9528 35  15 - 41 U/L Final   ALT 07/10/2022 31  0 - 44 U/L Final   Alkaline Phosphatase 07/10/2022 89  38 - 126 U/L Final   Total Bilirubin 07/10/2022 0.4  0.3 - 1.2 mg/dL Final   GFR, Estimated 07/10/2022 >60  >60 mL/min Final   Comment: (NOTE) Calculated using the CKD-EPI Creatinine Equation (2021)    Anion gap 07/10/2022 7  5 - 15 Final   Performed at [facility], [address] [place], [place] [address]   Vitamin B-12 07/10/2022 7,450 (H)  180 - 914 pg/mL Final   Comment: RESULTS CONFIRMED BY MANUAL DILUTION Performed at [facility], [address], [place], [place] [address]    Folate 07/10/2022 6.9  >5.9 ng/mL Final   Performed at [facility], [address], [place], [place] [address]   Iron 07/10/2022 23 (L)  28 - 170 ug/dL Final   TIBC 64/40/3474 565 (H)  250 - 450 ug/dL Final   Saturation Ratios 07/10/2022 4 (L)  10.4 - 31.8 % Final   UIBC 07/10/2022 542  ug/dL Final   Performed at [facility], [address], [place], [place] [address]   Ferritin 07/10/2022 3 (L)  11 - 307 ng/mL Final   Performed at [facility], [address], [place], [place] [address]   Retic Ct Pct 07/10/2022 1.3  0.4 - 3.1 % Final   RBC. 07/10/2022 4.97  3.87 - 5.11 MIL/uL Final   Retic Count, Absolute 07/10/2022 65.1  19.0 - 186.0 K/uL Final   Immature Retic Fract 07/10/2022 19.5 (H)  2.3 - 15.9 % Final   Performed at [facility], [address], [place], [place] [address]    RADIOGRAPHIC STUDIES: I have personally reviewed the radiological images as listed and agree with the findings in the report  No results found.  ASSESSMENT/PLAN  Patient is a 41 year old female with symptomatic microcytic anemia presumed  to be secondary to dysfunctional uterine bleeding.  Patient has also developed myalgias  Anemia:  Due to iron deficiency anemia owing to dysfunctional uterine bleeding/ exacerbated by high demand owing to prior pregnancies.  Consider future GI referral for pan endoscopy  Dysfunctional uterine bleeding:  This is characterized by menses that are prolonged, irregular as well as by heavy bleeding with passage of clots.  Will need to evaluate for possible bleeding disorder with PT, PTT, Fibrinogen, von Willebrand screen.    Therapeutics:  Since patient has symptomatic anemia with Hgb < 10  and has not tolerated/been compliant with oral iron will arrange for IV iron replacement.   Given the severe symptoms we prefer to replete iron stores in one or two visits rather than over the course of several months.  In addition ongoing blood loss exceeds the capacity of oral iron to meet needs. A discussion regarding risks was had with the patient. Patient elected to receive IV iron and has tolerated it well with improvement of symptoms.    To receive second dose of IV iron today  Lymphadenopathy:  Was noted on MRI femoral region performed on July 10 2022.  CT CAP on July 14 2022 demonstrated mild generalized adenopathy.   This is a common finding in the setting of COVID 19 infections.  Rheumatology serologies negative.  HIV, Hepatitis serologies, RPR and Lyme serologies negative.  CMV PCR negative.  EBV PCR mildly elevated but this may be secondary to steroids.  EBV reactivation can be seen in the setting of COVID 19.    Myositis:  Has been reported with COVID 19 infection.  Timing is consistent.  Rheumatology markers negative.  Improved on steroids.  Since symptoms rebounded with abrupt d/c of prednisone restarted it at 50 mg daily and will attempt to decrease to 40 mg daily later this week.  Anticipate that she will require a long taper.  Can hold on [facility] if patient desires.         Cancer  Staging  No matching staging information was found for the patient.   No problem-specific Assessment & Plan notes found for this encounter.   No orders of the defined types were placed in this encounter.   All questions were answered. The patient knows to call the clinic with any problems, questions or concerns.  This note was electronically signed.    Loni Muse, MD  07/22/2022 8:47 AM

## 2022-07-23 ENCOUNTER — Inpatient Hospital Stay: Payer: No Typology Code available for payment source

## 2022-07-23 ENCOUNTER — Inpatient Hospital Stay (INDEPENDENT_AMBULATORY_CARE_PROVIDER_SITE_OTHER): Payer: No Typology Code available for payment source | Admitting: Oncology

## 2022-07-23 VITALS — BP 141/84 | HR 84 | Temp 97.9°F | Resp 18 | Ht 64.6 in | Wt 246.0 lb

## 2022-07-23 VITALS — BP 138/87 | HR 99 | Temp 98.5°F | Resp 20 | Ht 64.6 in | Wt 246.0 lb

## 2022-07-23 DIAGNOSIS — R599 Enlarged lymph nodes, unspecified: Secondary | ICD-10-CM | POA: Diagnosis not present

## 2022-07-23 DIAGNOSIS — M60005 Infective myositis, unspecified leg: Secondary | ICD-10-CM | POA: Diagnosis not present

## 2022-07-23 DIAGNOSIS — D500 Iron deficiency anemia secondary to blood loss (chronic): Secondary | ICD-10-CM

## 2022-07-23 DIAGNOSIS — M609 Myositis, unspecified: Secondary | ICD-10-CM | POA: Diagnosis not present

## 2022-07-23 DIAGNOSIS — U099 Post covid-19 condition, unspecified: Secondary | ICD-10-CM | POA: Diagnosis not present

## 2022-07-23 LAB — FERRITIN: Ferritin: 236 ng/mL (ref 11–307)

## 2022-07-23 LAB — CK: Total CK: 72 U/L (ref 38–234)

## 2022-07-23 MED ORDER — ACETAMINOPHEN 325 MG PO TABS
650.0000 mg | ORAL_TABLET | Freq: Once | ORAL | Status: AC
  Administered 2022-07-23: 650 mg via ORAL
  Filled 2022-07-23: qty 2

## 2022-07-23 MED ORDER — INFLUENZA VAC SPLIT QUAD 0.5 ML IM SUSY
0.5000 mL | PREFILLED_SYRINGE | Freq: Once | INTRAMUSCULAR | Status: AC
  Administered 2022-07-23: 0.5 mL via INTRAMUSCULAR

## 2022-07-23 MED ORDER — SODIUM CHLORIDE 0.9 % IV SOLN: Freq: Once | INTRAVENOUS | Status: AC

## 2022-07-23 MED ORDER — SODIUM CHLORIDE 0.9 % IV SOLN
510.0000 mg | Freq: Once | INTRAVENOUS | Status: AC
  Administered 2022-07-23: 510 mg via INTRAVENOUS
  Filled 2022-07-23: qty 17

## 2022-07-23 MED ORDER — LORATADINE 10 MG PO TABS
10.0000 mg | ORAL_TABLET | Freq: Every day | ORAL | Status: DC
  Administered 2022-07-23: 10 mg via ORAL
  Filled 2022-07-23: qty 1

## 2022-07-23 NOTE — Patient Instructions (Signed)
Iron Deficiency Anemia, Adult  Iron deficiency anemia is a condition in which the concentration of red blood cells or hemoglobin in the blood is below normal because of too little iron. Hemoglobin is a substance in red blood cells that carries oxygen to the body's tissues. When the concentration of red blood cells or hemoglobin is too low, not enough oxygen reaches these tissues. Iron deficiency anemia is usually long-lasting, and it develops over time. It may or may not cause symptoms. It is a common type of anemia. What are the causes? This condition may be caused by: Not enough iron in the diet. Abnormal absorption in the gut. Blood loss. What increases the risk? You are more likely to develop this condition if you get menstrual periods (menstruate) or are pregnant. What are the signs or symptoms? Symptoms of this condition may include: Pale skin, lips, and nail beds. Weakness, dizziness, and getting tired easily. Shortness of breath when moving or exercising. Cold hands or feet. Mild anemia may not cause any symptoms. How is this diagnosed? This condition is diagnosed based on: Your medical history. A physical exam. Blood tests. How is this treated? This condition is treated by correcting the cause of your iron deficiency. Treatment may involve: Adding iron-rich foods to your diet. Taking iron supplements. If you are pregnant or breastfeeding, you may need to take extra iron because your normal diet usually does not provide the amount of iron that you need. Increasing vitamin C intake. Vitamin C helps your body absorb iron. Your health care provider may recommend that you take iron supplements along with a glass of orange juice or a vitamin C supplement. Medicines to make heavy menstrual flow lighter. Surgery or additional testing procedures to determine the cause of your anemia. You may need repeat blood tests to determine whether treatment is working. If the treatment does not  seem to be working, you may need more tests. Follow these instructions at home: Medicines Take over-the-counter and prescription medicines only as told by your health care provider. This includes iron supplements and vitamins. This is important because too much iron can be harmful. For the best iron absorption, you should take iron supplements when your stomach is empty. If you cannot tolerate them on an empty stomach, you may need to take them with food. Do not drink milk or take antacids at the same time as your iron supplements. Milk and antacids may interfere with how your body absorbs iron. Iron supplements may turn stool (feces) a darker color and it may appear black. If you cannot tolerate taking iron supplements by mouth, talk with your health care provider about taking them through an IV or through an injection into a muscle. Eating and drinking Talk with your health care provider before changing your diet. Your provider may recommend that you eat foods that contain a lot of iron, such as: Liver. Low-fat (lean) beef. Breads and cereals that have iron added to them (are fortified). Eggs. Dried fruit. Dark green, leafy vegetables. To help your body use the iron from iron-rich foods, eat those foods at the same time as fresh fruits and vegetables that are high in vitamin C. Foods that are high in vitamin C include: Oranges. Peppers. Tomatoes. Mangoes. Managing constipation If you are taking an iron supplement, it may cause constipation. To prevent or treat constipation, you may need to: Drink enough fluid to keep your urine pale yellow. Take over-the-counter or prescription medicines. Eat foods that are high in fiber, such   as beans, whole grains, and fresh fruits and vegetables. Limit foods that are high in fat and processed sugars, such as fried or sweet foods. General instructions Return to your normal activities as told by your health care provider. Ask your health care provider  what activities are safe for you. Keep all follow-up visits. Contact a health care provider if: You feel nauseous or you vomit. You feel weak. You become light-headed when getting up from a sitting or lying down position. You have unexplained sweating. You develop symptoms of constipation. You have a heaviness in your chest. You have trouble breathing with physical activity. Get help right away if: You faint. If this happens, do not drive yourself to the hospital. You have an irregular or rapid heartbeat. Summary Iron deficiency anemia is a condition in which the concentration of red blood cells or hemoglobin in the blood is below normal because of too little iron. This condition is treated by correcting the cause of your iron deficiency. Take over-the-counter and prescription medicines only as told by your health care provider. This includes iron supplements and vitamins. To help your body use the iron from iron-rich foods, eat those foods at the same time as fresh fruits and vegetables that are high in vitamin C. Seek medical help if you have signs or symptoms of worsening anemia. This information is not intended to replace advice given to you by your health care provider. Make sure you discuss any questions you have with your health care provider. Document Revised: 11/05/2021 Document Reviewed: 11/05/2021 [employer] Patient Education  2023 [employer] Iron-Rich Diet  Iron is a mineral that helps your body produce hemoglobin. Hemoglobin is a protein in red blood cells that carries oxygen to your body's tissues. Eating too little iron may cause you to feel weak and tired, and it can increase your risk of infection. Iron is naturally found in many foods, and many foods have iron added to them (are iron-fortified). You may need to follow an iron-rich diet if you do not have enough iron in your body due to certain medical conditions. The amount of iron that you need each day depends on your  age, your sex, and any medical conditions you have. Follow instructions from your health care provider or a dietitian about how much iron you should eat each day. What are tips for following this plan? Reading food labels Check food labels to see how many milligrams (mg) of iron are in each serving. Cooking Cook foods in pots and pans that are made from iron. Take these steps to make it easier for your body to absorb iron from certain foods: Soak beans overnight before cooking. Soak whole grains overnight and drain them before using. Ferment flours before baking, such as by using yeast in bread dough. Meal planning When you eat foods that contain iron, you should eat them with foods that are high in vitamin C. These include oranges, peppers, tomatoes, potatoes, and mangoes. Vitamin C helps your body absorb iron. Certain foods and drinks prevent your body from absorbing iron properly. Avoid eating these foods in the same meal as iron-rich foods or with iron supplements. These foods include: Coffee, black tea, and red wine. Milk, dairy products, and foods that are high in calcium. Beans and soybeans. Whole grains. General information Take iron supplements only as told by your health care provider. An overdose of iron can be life-threatening. If you were prescribed iron supplements, take them with orange juice or a vitamin C   supplement. When you eat iron-fortified foods or take an iron supplement, you should also eat foods that naturally contain iron, such as meat, poultry, and fish. Eating naturally iron-rich foods helps your body absorb the iron that is added to other foods or contained in a supplement. Iron from animal sources is better absorbed than iron from plant sources. What foods should I eat? Fruits Prunes. Raisins. Eat fruits high in vitamin C, such as oranges, grapefruits, and strawberries, with iron-rich foods. Vegetables Spinach (cooked). Green peas. Broccoli. Fermented  vegetables. Eat vegetables high in vitamin C, such as leafy greens, potatoes, bell peppers, and tomatoes, with iron-rich foods. Grains Iron-fortified breakfast cereal. Iron-fortified whole-wheat bread. Enriched rice. Sprouted grains. Meats and other proteins Beef liver. Beef. Turkey. Chicken. Oysters. Shrimp. Tuna. Sardines. Chickpeas. Nuts. Tofu. Pumpkin seeds. Beverages Tomato juice. Fresh orange juice. Prune juice. Hibiscus tea. Iron-fortified instant breakfast shakes. Sweets and desserts Blackstrap molasses. Seasonings and condiments Tahini. Fermented soy sauce. Other foods Wheat germ. The items listed above may not be a complete list of recommended foods and beverages. Contact a dietitian for more information. What foods should I limit? These are foods that should be limited while eating iron-rich foods as they can reduce the absorption of iron in your body. Grains Whole grains. Bran cereal. Bran flour. Meats and other proteins Soybeans. Products made from soy protein. Black beans. Lentils. Mung beans. Split peas. Dairy Milk. Cream. Cheese. Yogurt. Cottage cheese. Beverages Coffee. Black tea. Red wine. Sweets and desserts Cocoa. Chocolate. Ice cream. Seasonings and condiments Basil. Oregano. Large amounts of parsley. The items listed above may not be a complete list of foods and beverages you should limit. Contact a dietitian for more information. Summary Iron is a mineral that helps your body produce hemoglobin. Hemoglobin is a protein in red blood cells that carries oxygen to your body's tissues. Iron is naturally found in many foods, and many foods have iron added to them (are iron-fortified). When you eat foods that contain iron, you should eat them with foods that are high in vitamin C. Vitamin C helps your body absorb iron. Certain foods and drinks prevent your body from absorbing iron properly, such as whole grains and dairy products. You should avoid eating these foods  in the same meal as iron-rich foods or with iron supplements. This information is not intended to replace advice given to you by your health care provider. Make sure you discuss any questions you have with your health care provider. Document Revised: 09/09/2020 Document Reviewed: 09/09/2020 [employer] Patient Education  2023 [employer]  

## 2022-07-27 ENCOUNTER — Encounter: Payer: Self-pay | Admitting: Oncology

## 2022-07-27 ENCOUNTER — Other Ambulatory Visit: Payer: Self-pay

## 2022-08-03 ENCOUNTER — Other Ambulatory Visit: Payer: Self-pay | Admitting: Pulmonary Disease

## 2022-08-05 ENCOUNTER — Ambulatory Visit: Payer: No Typology Code available for payment source | Admitting: Oncology

## 2022-08-05 ENCOUNTER — Other Ambulatory Visit: Payer: No Typology Code available for payment source

## 2022-08-06 ENCOUNTER — Other Ambulatory Visit: Payer: Self-pay

## 2022-08-06 ENCOUNTER — Inpatient Hospital Stay (INDEPENDENT_AMBULATORY_CARE_PROVIDER_SITE_OTHER): Payer: No Typology Code available for payment source | Admitting: Oncology

## 2022-08-06 ENCOUNTER — Inpatient Hospital Stay: Payer: No Typology Code available for payment source

## 2022-08-06 VITALS — BP 164/90 | HR 99 | Temp 98.0°F | Resp 16 | Ht 64.6 in | Wt 256.6 lb

## 2022-08-06 DIAGNOSIS — M60005 Infective myositis, unspecified leg: Secondary | ICD-10-CM

## 2022-08-06 DIAGNOSIS — B2790 Infectious mononucleosis, unspecified without complication: Secondary | ICD-10-CM

## 2022-08-06 DIAGNOSIS — Z7952 Long term (current) use of systemic steroids: Secondary | ICD-10-CM

## 2022-08-06 DIAGNOSIS — D500 Iron deficiency anemia secondary to blood loss (chronic): Secondary | ICD-10-CM | POA: Diagnosis not present

## 2022-08-06 DIAGNOSIS — R599 Enlarged lymph nodes, unspecified: Secondary | ICD-10-CM

## 2022-08-06 DIAGNOSIS — U099 Post covid-19 condition, unspecified: Secondary | ICD-10-CM

## 2022-08-06 NOTE — Progress Notes (Signed)
[facility] Follow up Visit:  Patient Care Team: Carol Ada, MD as PCP - General (Family Medicine)  CHIEF COMPLAINTS/PURPOSE OF CONSULTATION:  Oncology History   No history exists.    HISTORY OF PRESENTING ILLNESS: Tara Farrell 41 y.o. female is here because of anemia, myalgias, adenopathy.    September 09/2013: WBC 8.5 hemoglobin 11.4 MCV 69 platelet count 242   June 18, 2022:  Patient had COVID 19 in August 2023.  Her symptoms included fatigue, generalized myalgias, sore throat, anorexia, diarrhea, HA, anosmia, decreased sense of taste  She had a lot of nasal congestion, SOB.  She was treated with Paxlovid.  Sense of smell is coming back.  Myalgias improved after Paxlovid but she developed new myalgias involving her legs which began in September 2023.    Saw PA at PCP's office for evaluation of bilateral hip and leg pain.  Left hip pain began a week prior.  Patient was instructed to begin meloxicam and methocarbamol without relief of symptoms.  June 20 2022:  Presented to urgent care with hip and leg pain.  Treated with steroid injections to both hips for presumed bursitis without benefit.  June 21 2022:  Began steroid dose pack without benefit.    July 10, 2022:  Seen at [facility].   MRI of the right hip showed a small hip joint effusion.  No muscle or tendon signal abnormalities.  Multiple and large internal and external iliac lymph nodes the largest measuring up to 1.6 cm in short axis and a 3.2 cm right ovarian cyst.   Presented to [facility] health emergency room with shortness of breath, dizziness, lightheadedness, WBC 5.3 hemoglobin 7.2 MCV 54 platelet count 342; 74 segs 16 lymphs 8 monos 2 eos.  Teardrops polychromasia and target cells noted.  Reticulocyte count 1.3% INR 1.1   CMP notable for sodium 134 potassium 3.2 creatinine 0.44 T. bili 0.4 B12 7450 folate 6.9 ferritin 3  Patient was placed on iron iron and potassium.  She was  referred to primary care physician  Other laboratories notable for CRP of 6 (upper limit of normal 10) CPK 502 (upper limit of normal 165)  July 13 2022:  [facility]  Patient is G2 P1011 Menopause not reached.  Menses occur monthly and regular and last 5 to 6 days.  Bleeding is moderate.  Does not have bleeding between periods.    Does not pass clots.  Last pregnancy was delivered by NSVD section in 2014.  No postpartum hemorrhage.    Patient does not have history of uterine fibroids/uterine abnormalities.  Has taken oral iron in the past.  Has not received IV iron/ required PRBC's in the past.  Has tolerated oral iron owing to GI intolerance.   Has a normal diet.  No hematochezia, melena, hemoptysis, hematuria.   No history of intra-articular or soft tissue bleeding  No history of abnormal bleeding in family members Patient has symptoms of fatigue, pallor,  DOE, decreased performance status.  Patient has pica to ice.    Social:  Married.  Works for [employer].  Tobacco none.  EtOH occasionally  [facility] Mother alive 57 Graves Disease, HTN Father alive 52 none that patient is aware of Brother alive 42 well Sister alive 22 well  Coombs test (IgG +/complement negative) no specific antibody demonstrating eluate.  Haptoglobin 213  Hemoglobin electrophoresis normal SPEP with IEP pending.  Serum free kappa 25.6, lambda 18.3 with a kappa lambda 1.40  ANA panel  negative.  RF negative.  C3 169 (elevated) C4 11 enthesis lower limit of normal 12) Lyme disease panel negative.  RPR negative.  CMV DNA PCR negative EBV DNA 52 by PCR.  Hepatitis ABC serologies negative.  HIV negative LDH 157 myoglobin 42 TSH/free T40.633/1.08. Pregnancy test negative  July 14 2022: CT chest abdomen pelvis.  Prominent/mildly enlarged lymph nodes above and below the diaphragm nonspecific.  No splenomegaly.  Mild symmetrical esophageal wall thickening.  Scattered colonic diverticulosis without  findings of acute diverticulitis.  Uterine leiomyomas.  3.1 cm right ovarian cyst considered benign  July 16 2022: Scheduled follow up for management of anemia and myositis.  Reviewed results of labs and CT scans with patient and her husband. She feels better overall.  Arthralgias and myalgias, fatigue have improved with Prednisone 50 mg daily (last dose yesterday) and Ibuprofen 800 mg tid.  She is taking a PPI and famotidine for GERD.  Sleeping better.  To begin IV iron today    July 23 2022:  Scheduled follow up for management of anemia and myositis.  Patient called on October 7 stating that she having severe myalgias having stopped prednisone on October 4.  I called in script for prednisone 50 mg daily which she started on July 19 2022.  Feels much better today.  Myalgias down to a 2/10 with help of tylenol and biofreze.  Sleeping off and on at night but to myalgias and arthralgias of hips.   Received second dose of IV iron today.   Pica improving; fatigue better.  Patient will decrease prednisone to 40 mg on July 26 2022  Ferritin 236 CK 72  August 06 2022:  Scheduled follow up for management of anemia and myositis  Reviewed results of labs from last visit.  Currently on Prednisone 40 mg daily Not having any myositis.  Not using any biofreze.  Sleeping well.  Not using heating pad.  Not fatigued  Decrease prednisone to 20 mg daily x 1 week then to 15 mg. Follow up in 2 weeks.    Review of Systems  Constitutional:  Positive for fatigue. Negative for chills, fever and unexpected weight change.  HENT:   Negative for mouth sores, nosebleeds, sore throat and trouble swallowing.   Eyes:  Negative for eye problems and icterus.  Respiratory:  Negative for chest tightness, cough, hemoptysis and shortness of breath.   Cardiovascular:  Negative for chest pain, leg swelling and palpitations.  Gastrointestinal:  Negative for abdominal pain, blood in stool, constipation, diarrhea and nausea.   Genitourinary:  Negative for difficulty urinating, dysuria and hematuria.   Musculoskeletal:  Positive for arthralgias and myalgias. Negative for gait problem.       Occasional lumbar pain Arthralgias and myalgias improving  Neurological:  Negative for gait problem, headaches, light-headedness and numbness.       Occasional dizziness  Hematological:  Negative for adenopathy. Does not bruise/bleed easily.  Psychiatric/Behavioral:  Negative for depression.        No longer awakes in the middle of the night.  Sleep improved    MEDICAL HISTORY: Past Medical History:  Diagnosis Date   GERD (gastroesophageal reflux disease)    Medical history non-contributory    Preterm labor    18 week SAB    SURGICAL HISTORY: Past Surgical History:  Procedure Laterality Date   CERVICAL CERCLAGE N/A 03/21/2013   Procedure: CERCLAGE CERVICAL;  Surgeon: Lenoard Aden, MD;  Location: [facility];  Service: Gynecology;  Laterality: N/A;  EDD: 09/25/13  DIAGNOSTIC LAPAROSCOPY     DILATION AND CURETTAGE OF UTERUS      SOCIAL HISTORY: Social History   Socioeconomic History   Marital status: Married    Spouse name: Molli Hazard   Number of children: 1   Years of education: 14   Highest education level: Some college, no degree  Occupational History   Not on file  Tobacco Use   Smoking status: Never   Smokeless tobacco: Never  Vaping Use   Vaping Use: Never used  Substance and Sexual Activity   Alcohol use: No   Drug use: No   Sexual activity: Not Currently    Birth control/protection: None  Other Topics Concern   Not on file  Social History Narrative   Not on file   Social Determinants of Health   Financial Resource Strain: Not on file  Food Insecurity: Not on file  Transportation Needs: Not on file  Physical Activity: Not on file  Stress: Not on file  Social Connections: Not on file  Intimate Partner Violence: Not on file    FAMILY HISTORY Family History  Problem Relation Age of  Onset   Healthy Mother    Healthy Father     ALLERGIES:  has No Known Allergies.  MEDICATIONS:  Current Outpatient Medications  Medication Sig Dispense Refill   albuterol (VENTOLIN HFA) 108 (90 Base) MCG/ACT inhaler USE 1 TO 2 INHALATIONS BY MOUTH  EVERY 6 HOURS AS NEEDED FOR  WHEEZING OR SHORTNESS OF BREATH 26.8 g 0   famotidine (PEPCID) 40 MG tablet Take 40 mg by mouth daily.     ferrous sulfate 325 (65 FE) MG tablet Take one tablet a day for five days. If you are tolerating it, increase to twice a day. If after another five days you are still tolerating it, increase to three times a day. At any point if you develop intolerable side effects, go back to the lower dose. 90 tablet 0   montelukast (SINGULAIR) 10 MG tablet Take 1 tablet (10 mg total) by mouth at bedtime. 30 tablet 11   omeprazole (PRILOSEC) 20 MG capsule Take 20 mg by mouth daily.     potassium chloride SA (KLOR-CON M) 20 MEQ tablet Take 1 tablet (20 mEq total) by mouth 2 (two) times daily. 10 tablet 0   predniSONE (DELTASONE) 10 MG tablet Take 50 mg by mouth daily.     SYMBICORT 80-4.5 MCG/ACT inhaler Inhale 2 puffs into the lungs in the morning and at bedtime.     No current facility-administered medications for this visit.    PHYSICAL EXAMINATION:  ECOG PERFORMANCE STATUS: 1 - Symptomatic but completely ambulatory   There were no vitals filed for this visit.   There were no vitals filed for this visit.    Physical Exam Vitals and nursing note reviewed.  Constitutional:      General: She is not in acute distress.    Appearance: Normal appearance. She is not ill-appearing, toxic-appearing or diaphoretic.     Comments: Here alone.  Comfortable  HENT:     Head: Normocephalic and atraumatic.     Right Ear: External ear normal.     Left Ear: External ear normal.     Nose: Nose normal. No congestion or rhinorrhea.  Eyes:     General: No scleral icterus.    Extraocular Movements: Extraocular movements intact.      Conjunctiva/sclera: Conjunctivae normal.     Pupils: Pupils are equal, round, and reactive to light.  Cardiovascular:  Rate and Rhythm: Normal rate and regular rhythm.     Heart sounds: Normal heart sounds. No murmur heard.    No friction rub. No gallop.  Pulmonary:     Effort: Pulmonary effort is normal. No respiratory distress.     Breath sounds: Normal breath sounds. No stridor. No wheezing, rhonchi or rales.  Chest:     Chest wall: No tenderness.  Abdominal:     General: Bowel sounds are normal. There is no distension.     Palpations: Abdomen is soft. There is no mass.     Tenderness: There is no abdominal tenderness. There is no rebound.  Musculoskeletal:        General: No swelling, tenderness or deformity.     Cervical back: Normal range of motion and neck supple. No rigidity or tenderness.  Lymphadenopathy:     Head:     Right side of head: No submental, submandibular, tonsillar, preauricular, posterior auricular or occipital adenopathy.     Left side of head: No submental, submandibular, tonsillar, preauricular, posterior auricular or occipital adenopathy.     Cervical: No cervical adenopathy.     Right cervical: No superficial, deep or posterior cervical adenopathy.    Left cervical: No superficial, deep or posterior cervical adenopathy.     Upper Body:     Right upper body: No supraclavicular, axillary, pectoral or epitrochlear adenopathy.     Left upper body: No supraclavicular, axillary, pectoral or epitrochlear adenopathy.  Skin:    General: Skin is warm.     Coloration: Skin is not jaundiced or pale.     Findings: No bruising or erythema.  Neurological:     General: No focal deficit present.     Mental Status: She is alert and oriented to person, place, and time. Mental status is at baseline.     Cranial Nerves: No cranial nerve deficit.  Psychiatric:        Mood and Affect: Mood normal.        Behavior: Behavior normal.        Thought Content: Thought  content normal.        Judgment: Judgment normal.     LABORATORY DATA: I have personally reviewed the data as listed:  Office Visit on 07/23/2022  Component Date Value Ref Range Status   Total CK 07/23/2022 72  38 - 234 U/L Final   Performed at [facility], [address], [place], [place] [address]   Ferritin 07/23/2022 236  11 - 307 ng/mL Final   Performed at [facility], [address], [place], [place] [address]  Office Visit on 07/13/2022  Component Date Value Ref Range Status   Myoglobin 07/13/2022 42  25 - 58 ng/mL Final   Comment: (NOTE) Performed At: [facility] [address] [place], [place] [id number] Jolene Schimke MD XI:[id number]    CMV DNA, Qual PCR 07/13/2022 Negative  Negative Final   Comment: (NOTE) No Cytomegalovirus DNA Detected. This test was developed and its performance characteristics determined by [facility].  It has not been cleared or approved by the Food and Drug Administration.  The FDA has determined that such clearance or approval is not necessary. Performed At: [facility] [address] [place], [place] [id number] Jolene Schimke MD KW:[id number]    Hepatitis B Surface Ag 07/13/2022 NON REACTIVE  NON REACTIVE Final   HCV Ab 07/13/2022 NON REACTIVE  NON REACTIVE Final   Comment: (NOTE) Nonreactive HCV antibody screen is consistent with no  HCV infections,  unless recent infection is suspected or other evidence exists to indicate HCV infection.     Hep A IgM 07/13/2022 NON REACTIVE  NON REACTIVE Final   Hep B C IgM 07/13/2022 NON REACTIVE  NON REACTIVE Final   Performed at [facility], [address] [place], [place] [address]   HIV Screen 4th Generation wRfx 07/13/2022 Non Reactive  Non Reactive Final   Performed at [facility], [address] [place], [place] [address]   RPR Ser Ql 07/13/2022 NON REACTIVE  NON REACTIVE Final   Performed at [facility], [address] [place], [place] [address]   Lyme Total Antibody EIA 07/13/2022 Negative  Negative Final   Comment: (NOTE) Lyme antibodies not detected. Reflex testing is not indicated. No laboratory evidence of infection with B. burgdorferi (Lyme disease). Negative results may occur in patients recently infected (less than or equal to 14 days) with B. burgdorferi.  If recent infection is suspected, repeat testing on a new sample collected in 7 to 14 days is recommended. Performed At: [facility] [place] [address] [place], [place] [id number] Jolene SchimkeNagendra Sanjai MD OZ:[id number]:[phone]    EBV DNA QN by PCR 07/13/2022 52  Negative IU/mL Final   Comment: The linear range of this assay is 35   100,000,000 IU/mL    log10 EBV DNA Qn PCR 07/13/2022 1.716  log10 IU/mL Final   Comment: (NOTE) Performed At: [facility] [place] [address] [place], [place] [id number] Jolene SchimkeNagendra Sanjai MD KG:[id number]:[phone]    Free T4 07/13/2022 1.08  0.61 - 1.12 ng/dL Final   Comment: (NOTE) Biotin ingestion may interfere with free T4 tests. If the results are inconsistent with the TSH level, previous test results, or the clinical presentation, then consider biotin interference. If needed, order repeat testing after stopping biotin. Performed at [facility], [address] [place], [place] [address]    TSH 07/13/2022 0.633  0.350 - 4.500 uIU/mL Final   Comment: Performed by a 3rd Generation assay with a functional sensitivity of <=0.01 uIU/mL. Performed at [facility], [address] [place], [place] [address]   Appointment on 07/13/2022  Component Date Value Ref Range Status   Haptoglobin 07/13/2022 213  33 - 278 mg/dL Final   Comment: (NOTE) Performed At: [facility] [place] [address] [place], [place] [id number] Jolene SchimkeNagendra Sanjai MD PP:[id number]:[phone]    Zinc 07/13/2022 50  44 - 115 ug/dL Final   Comment: (NOTE) This test was developed and its performance characteristics determined by  [facility]. It has not been cleared or approved by the Food and Drug Administration.                                Detection Limit = 5 Performed At: [facility] [place] [address] [place], [place] [id number] Jolene SchimkeNagendra Sanjai MD XN:[id number]:[phone]    Hgb F 07/13/2022 0.0  0.0 - 2.0 % Final   Hgb A 07/13/2022 97.8  96.4 - 98.8 % Final   Hgb A2 07/13/2022 2.2  1.8 - 3.2 % Final   Hgb S 07/13/2022 0.0  0.0 % Final   Interpretation, Hgb Fract 07/13/2022 Comment   Final   Comment: (NOTE) Normal hemoglobin present; no hemoglobin variant or beta thalassemia identified. Note: Alpha thalassemia may not be detected by the Hgb Fractionation Cascade panel. If alpha thalassemia is suspected, [facility] offers Alpha-Thalassemia DNA Analysis [phone] Performed  At: [facility] [address] [place], [place] [id number] Rush Farmer MD ZD:[id number]    Kappa free light chain 07/13/2022 25.6 (H)  3.3 - 19.4 mg/L Final   Lambda free light chains 07/13/2022 18.3  5.7 - 26.3 mg/L Final   Kappa, lambda light chain ratio 07/13/2022 1.40  0.26 - 1.65 Final   Comment: (NOTE) Performed At: [facility] [place], [place] [id number] Rush Farmer MD EP:[id number]    IgG (Immunoglobin G), Serum 07/13/2022 2,965 (H)  586 - 1,602 mg/dL Corrected   CORRECTED ON 10/09 AT 1136: PREVIOUSLY REPORTED AS 3056 Reference range: 586 to 1602   IgA 07/13/2022 399 (H)  87 - 352 mg/dL Corrected   CORRECTED ON 10/09 AT 1136: PREVIOUSLY REPORTED AS 392 Reference range: 87 to 352   IgM (Immunoglobulin M), Srm 07/13/2022 125  26 - 217 mg/dL Corrected   CORRECTED ON 10/09 AT 1136: PREVIOUSLY REPORTED AS 120 Reference range: 26 to 217   Total Protein ELP 07/13/2022 8.8 (H)  6.0 - 8.5 g/dL Corrected   Albumin SerPl Elph-Mcnc 07/13/2022 3.7  2.9 - 4.4 g/dL Corrected   CORRECTED ON 10/09 AT 1136: PREVIOUSLY REPORTED AS 3.6 Reference range: 2.9 to 4.4   Alpha 1 07/13/2022 0.2  0.0 - 0.4 g/dL Corrected    Alpha2 Glob SerPl Elph-Mcnc 07/13/2022 0.8  0.4 - 1.0 g/dL Corrected   B-Globulin SerPl Elph-Mcnc 07/13/2022 1.3  0.7 - 1.3 g/dL Corrected   CORRECTED ON 10/09 AT 1136: PREVIOUSLY REPORTED AS 1.4 Reference range: 0.7 to 1.3   Gamma Glob SerPl Elph-Mcnc 07/13/2022 2.8 (H)  0.4 - 1.8 g/dL Corrected   M Protein SerPl Elph-Mcnc 07/13/2022 Not Observed  Not Observed g/dL Corrected   Globulin, Total 07/13/2022 5.1 (H)  2.2 - 3.9 g/dL Corrected   CORRECTED ON 10/09 AT 1136: PREVIOUSLY REPORTED AS 5.2 Reference range: 2.2 to 3.9   Albumin/Glob SerPl 07/13/2022 0.8  0.7 - 1.7 Corrected   CORRECTED ON 10/09 AT 1136: PREVIOUSLY REPORTED AS 0.7 Reference range: 0.7 to 1.7   IFE 1 07/13/2022 Comment (A)   Corrected   Polyclonal increase detected in one or more immunoglobulins.   Please Note 07/13/2022 Comment   Corrected   Comment: (NOTE) Protein electrophoresis scan will follow via computer, mail, or courier delivery. Performed At: [facility] [place], [place] [id number] Rush Farmer MD UX:[id number]    CK-MM 07/13/2022 100  97 - 100 % Final   CK-MB 07/13/2022 0  0 - 3 % Final   CK-BB 07/13/2022 0  0 % Final   Comment: (NOTE) Performed At: [facility] [place], [place] [id number] Rush Farmer MD JS:[id number]    Creatine Kinase-Total 07/13/2022 244 (H)  32 - 182 U/L Corrected   Macro Type 1 07/13/2022 0  Not Observed % Final   Macro Type 2 07/13/2022 0  Not Observed % Final   Rhuematoid fact SerPl-aCnc 07/13/2022 <10.0  <14.0 IU/mL Final   Comment: (NOTE) Performed At: [facility] [address] [place], [place] [id number] Rush Farmer MD RS:[id number]    LDH 07/13/2022 157  98 - 192 U/L Final   Performed at [facility], [place] [address], [place], [place] [address]   C3 Complement 07/13/2022 169 (H)  82 - 167 mg/dL Final   Complement C4, Body Fluid 07/13/2022 11 (L)  12 - 38 mg/dL Final   Comment:  (NOTE) Performed At: [facility] [address] [place], [place] [id number]  Jolene Schimke MD OJ:[id number]    ds DNA Ab 07/13/2022 <1  0 - 9 IU/mL Final   Comment: (NOTE)                                   Negative      <5                                   Equivocal  5 - 9                                   Positive      >9    Ribonucleic Protein 07/13/2022 0.5  0.0 - 0.9 AI Final   ENA SM Ab Ser-aCnc 07/13/2022 <0.2  0.0 - 0.9 AI Final   Scleroderma (Scl-70) (ENA) Antibod* 07/13/2022 <0.2  0.0 - 0.9 AI Final   SSA (Ro) (ENA) Antibody, IgG 07/13/2022 <0.2  0.0 - 0.9 AI Final   SSB (La) (ENA) Antibody, IgG 07/13/2022 <0.2  0.0 - 0.9 AI Final   Chromatin Ab SerPl-aCnc 07/13/2022 0.2  0.0 - 0.9 AI Final   Anti JO-1 07/13/2022 <0.2  0.0 - 0.9 AI Final   Centromere Ab Screen 07/13/2022 <0.2  0.0 - 0.9 AI Final   See below: 07/13/2022 Comment   Final   Comment: (NOTE) Pattern              Potential Disease Association -------------  --------------------------------------------- Homogeneous    Systemic Lupus Erythematosus, Drug Induced               Systemic Lupus Erythematosus, Chronic               Autoimmune hepatitis, Juvenile Idiopathic               Arthritis -------------  --------------------------------------------- Speckled       Sjogren Syndrome, Systemic Lupus               Erythematosus, Subacute Cutaneous Lupus,               Neonatal Lupus, Congenital Heart Block,               Mixed Connective Tissue Disease,               Scleroderma-diffuse, Scleroderma-Autoimmune               Myositis Overlap Syndrome, Systemic Lupus               Erythematosus-Scleroderma-Autoimmune               Myositis Overlap Syndrome, Systemic               Autoimmune Rheumatic Disease,               Undifferentiated Connective Tissue Disease -------------  --------------------------------------------- Nucleolar      Systemic Sclero                          sis,  Scleroderma-Autoimmune               Myositis Overlap Syndrome, Sjogren               Syndrome, Raynaud phenomenon, Pulmonary  Arterial Hypertension, Systemic Autoimmune               Rheumatic Disease, Cancer -------------  --------------------------------------------- Centromere     Scleroderma-CREST, Limited Cutaneous SSc,               Raynaud's Phenomenon, Primary Biliary               Cholangitis -------------  --------------------------------------------- Nuclear Dot    Primary Biliary Cholangitis -------------  --------------------------------------------- Nuclear        Primary Biliary Cholangitis, Autoimmune Membrane       Hepatitis/Liver disease, Systemic Autoimmune               Rheumatic Disease, Autoimmune Cytopenias,               Linear Scleroderma, Antiphospholipid Syndrome -------------  --------------------------------------------- Performed At: [facility] [address] [place], [place] [id number] Jolene Schimke MD Ph:[phone]                          [phone]    Preg, Serum 07/13/2022 NEGATIVE  NEGATIVE Final   Comment:        THE SENSITIVITY OF THIS METHODOLOGY IS >10 mIU/mL. Performed at [facility], [address], [place], [place] [address]    DAT, complement 07/13/2022 NEG   Final   DAT, IgG 07/13/2022 POS   Final   Antibody ID,T Eluate 07/13/2022    Final                   Value:NO SPECIFIC ANTIBODY DEMONSTRATED IN ELUATE Performed at [facility], [address], [place], [place] [address]    Copper 07/13/2022 136  80 - 158 ug/dL Final   Comment: (NOTE) This test was developed and its performance characteristics determined by [facility]. It has not been cleared or approved by the Food and Drug Administration.                                Detection Limit = 5 Performed At: [facility] [place] [address] [place], [place] [id number] Jolene Schimke MD NA:[id number]   Admission on 07/10/2022,  Discharged on 07/11/2022  Component Date Value Ref Range Status   WBC 07/10/2022 5.3  4.0 - 10.5 K/uL Final   RBC 07/10/2022 5.02  3.87 - 5.11 MIL/uL Final   Hemoglobin 07/10/2022 7.2 (L)  12.0 - 15.0 g/dL Final   Comment: Reticulocyte Hemoglobin testing may be clinically indicated, consider ordering this additional test [id number]    HCT 07/10/2022 27.1 (L)  36.0 - 46.0 % Final   MCV 07/10/2022 54.0 (L)  80.0 - 100.0 fL Final   MCH 07/10/2022 14.3 (L)  26.0 - 34.0 pg Final   MCHC 07/10/2022 26.6 (L)  30.0 - 36.0 g/dL Final   RDW 62/83/1517 24.0 (H)  11.5 - 15.5 % Final   Platelets 07/10/2022 342  150 - 400 K/uL Final   nRBC 07/10/2022 0.0  0.0 - 0.2 % Final   Neutrophils Relative % 07/10/2022 74  % Final   Neutro Abs 07/10/2022 4.0  1.7 - 7.7 K/uL Final   Lymphocytes Relative 07/10/2022 16  % Final   Lymphs Abs 07/10/2022 0.8  0.7 - 4.0 K/uL Final   Monocytes Relative 07/10/2022 8  % Final   Monocytes Absolute 07/10/2022 0.4  0.1 - 1.0 K/uL Final   Eosinophils Relative 07/10/2022 2  %  Final   Eosinophils Absolute 07/10/2022 0.1  0.0 - 0.5 K/uL Final   Basophils Relative 07/10/2022 0  % Final   Basophils Absolute 07/10/2022 0.0  0.0 - 0.1 K/uL Final   Immature Granulocytes 07/10/2022 0  % Final   Abs Immature Granulocytes 07/10/2022 0.01  0.00 - 0.07 K/uL Final   Tear Drop Cells 07/10/2022 PRESENT   Final   Polychromasia 07/10/2022 PRESENT   Final   Target Cells 07/10/2022 PRESENT   Final   Ovalocytes 07/10/2022 PRESENT   Final   Performed at [facility], [address], [place], [place] [address]   ABO/RH(D) 07/10/2022 A POS   Final   Antibody Screen 07/10/2022 POS   Final   Sample Expiration 07/10/2022 07/13/2022,2359   Final   Antibody Identification 07/10/2022 NO CLINICALLY SIGNIFICANT ANTIBODY IDENTIFIED   Final   DAT, IgG 07/10/2022    Final                   Value:NEG Performed at [facility], [address], [place], [place]  [address]    Prothrombin Time 07/10/2022 13.7  11.4 - 15.2 seconds Final   INR 07/10/2022 1.1  0.8 - 1.2 Final   Comment: (NOTE) INR goal varies based on device and disease states. Performed at [facility], [address], [place], [place] [address]    Sodium 07/10/2022 134 (L)  135 - 145 mmol/L Final   Potassium 07/10/2022 3.2 (L)  3.5 - 5.1 mmol/L Final   Chloride 07/10/2022 104  98 - 111 mmol/L Final   CO2 07/10/2022 23  22 - 32 mmol/L Final   Glucose, Bld 07/10/2022 93  70 - 99 mg/dL Final   Glucose reference range applies only to samples taken after fasting for at least 8 hours.   BUN 07/10/2022 14  6 - 20 mg/dL Final   Creatinine, Ser 07/10/2022 0.44  0.44 - 1.00 mg/dL Final   Calcium 91/47/8295 9.1  8.9 - 10.3 mg/dL Final   Total Protein 62/13/0865 8.9 (H)  6.5 - 8.1 g/dL Final   Albumin 78/46/9629 3.6  3.5 - 5.0 g/dL Final   AST 52/84/1324 35  15 - 41 U/L Final   ALT 07/10/2022 31  0 - 44 U/L Final   Alkaline Phosphatase 07/10/2022 89  38 - 126 U/L Final   Total Bilirubin 07/10/2022 0.4  0.3 - 1.2 mg/dL Final   GFR, Estimated 07/10/2022 >60  >60 mL/min Final   Comment: (NOTE) Calculated using the CKD-EPI Creatinine Equation (2021)    Anion gap 07/10/2022 7  5 - 15 Final   Performed at [facility], [address] [place], [place] [address]   Vitamin B-12 07/10/2022 7,450 (H)  180 - 914 pg/mL Final   Comment: RESULTS CONFIRMED BY MANUAL DILUTION Performed at [facility], [address] [place], [place] [address]    Folate 07/10/2022 6.9  >5.9 ng/mL Final   Performed at [facility], [address], [place], [place] [address]   Iron 07/10/2022 23 (L)  28 - 170 ug/dL Final   TIBC 47/42/5956 565 (H)  250 - 450 ug/dL Final   Saturation Ratios 07/10/2022 4 (L)  10.4 - 31.8 % Final   UIBC 07/10/2022 542  ug/dL Final   Performed at [facility], [address], [place], [place]  [address]   Ferritin 07/10/2022 3 (L)  11 - 307 ng/mL Final   Performed  at [facility], [address] [place], [place] [address]   Retic Ct Pct 07/10/2022 1.3  0.4 - 3.1 % Final   RBC. 07/10/2022 4.97  3.87 - 5.11 MIL/uL Final   Retic Count, Absolute 07/10/2022 65.1  19.0 - 186.0 K/uL Final   Immature Retic Fract 07/10/2022 19.5 (H)  2.3 - 15.9 % Final   Performed at [facility], [address], [place], [place] [address]    RADIOGRAPHIC STUDIES: I have personally reviewed the radiological images as listed and agree with the findings in the report  No results found.  ASSESSMENT/PLAN  Patient is a 41 year old female with symptomatic microcytic anemia presumed to be secondary to dysfunctional uterine bleeding.  Patient has also developed myalgias    Anemia:  Due to iron deficiency anemia owing to dysfunctional uterine bleeding/ exacerbated by high demand owing to prior pregnancies.  Consider future GI referral for pan endoscopy  Dysfunctional uterine bleeding:  This is characterized by menses that are prolonged, irregular as well as by heavy bleeding with passage of clots.  Will need to evaluate for possible bleeding disorder with PT, PTT, Fibrinogen, von Willebrand screen.    Therapeutics:  Since patient had symptomatic anemia with Hgb < 10  and had not tolerated oral iron received IV iron replacement.     Octpber 5 2023 :  Feraheme 510 mg   July 23 2022:  Feraheme 510 mg     Lymphadenopathy:  Was noted on MRI femoral region performed on July 10 2022.  CT CAP on July 14 2022 demonstrated mild generalized adenopathy.   This is a common finding in the setting of COVID 19 infections.  Rheumatology serologies negative.  HIV, Hepatitis serologies, RPR and Lyme serologies negative.  CMV PCR negative.  EBV PCR mildly elevated but this may be secondary to steroids.  EBV reactivation can be seen in the setting of COVID 19.    Myositis:  Reported  with COVID 19 infection.  Timing is consistent.  Rheumatology markers negative.  Improved on steroids.  Since symptoms rebounded with abrupt d/c of prednisone restarted it at 50 mg daily  July 23 2022:  Decreased prednisone to 40 mg daily August 06 2022:  Decrease prednisone to  20 mg daily x 1 week then to 15 mg.    Cancer Staging  No matching staging information was found for the patient.   No problem-specific Assessment & Plan notes found for this encounter.   No orders of the defined types were placed in this encounter.   All questions were answered. The patient knows to call the clinic with any problems, questions or concerns.  This note was electronically signed.    Loni Muse, MD  08/06/2022 3:55 PM

## 2022-08-06 NOTE — Patient Instructions (Signed)
Please decrease prednisone to 20 mg daily and if doing well for a week decrease dose to 15 mg daily.

## 2022-08-07 LAB — FIBRINOGEN: Fibrinogen: 331 mg/dL (ref 210–475)

## 2022-08-07 LAB — PROTIME-INR
INR: 1 (ref 0.8–1.2)
Prothrombin Time: 13.5 seconds (ref 11.4–15.2)

## 2022-08-07 LAB — APTT: aPTT: 26 seconds (ref 24–36)

## 2022-08-08 LAB — VON WILLEBRAND PANEL
Coagulation Factor VIII: 194 % — ABNORMAL HIGH (ref 56–140)
Ristocetin Co-factor, Plasma: 151 % (ref 50–200)
Von Willebrand Antigen, Plasma: 276 % — ABNORMAL HIGH (ref 50–200)

## 2022-08-08 LAB — COAG STUDIES INTERP REPORT

## 2022-08-12 DIAGNOSIS — Z7952 Long term (current) use of systemic steroids: Secondary | ICD-10-CM | POA: Insufficient documentation

## 2022-08-17 ENCOUNTER — Ambulatory Visit: Payer: No Typology Code available for payment source | Admitting: Oncology

## 2022-08-17 ENCOUNTER — Other Ambulatory Visit: Payer: No Typology Code available for payment source

## 2022-08-26 ENCOUNTER — Other Ambulatory Visit: Payer: No Typology Code available for payment source

## 2022-08-26 ENCOUNTER — Ambulatory Visit: Payer: No Typology Code available for payment source | Admitting: Oncology

## 2022-08-27 ENCOUNTER — Inpatient Hospital Stay: Payer: No Typology Code available for payment source

## 2022-08-27 ENCOUNTER — Inpatient Hospital Stay: Payer: No Typology Code available for payment source | Admitting: Oncology

## 2022-09-14 ENCOUNTER — Inpatient Hospital Stay: Payer: No Typology Code available for payment source | Attending: Oncology | Admitting: Oncology

## 2022-09-14 DIAGNOSIS — Z7189 Other specified counseling: Secondary | ICD-10-CM

## 2022-09-14 DIAGNOSIS — M60005 Infective myositis, unspecified leg: Secondary | ICD-10-CM | POA: Diagnosis not present

## 2022-09-14 DIAGNOSIS — D500 Iron deficiency anemia secondary to blood loss (chronic): Secondary | ICD-10-CM

## 2022-09-14 DIAGNOSIS — U099 Post covid-19 condition, unspecified: Secondary | ICD-10-CM | POA: Diagnosis not present

## 2022-09-14 NOTE — Progress Notes (Signed)
[facility] Follow up Visit:  Patient Care Team: Carol Ada, MD as PCP - General (Family Medicine)  CHIEF COMPLAINTS/PURPOSE OF CONSULTATION:  Oncology History   No history exists.    HISTORY OF PRESENTING ILLNESS: Tara Farrell 41 y.o. female is here because of anemia, myalgias, adenopathy.    September 09/2013: WBC 8.5 hemoglobin 11.4 MCV 69 platelet count 242   June 18, 2022:  Patient had COVID 19 in August 2023.  Her symptoms included fatigue, generalized myalgias, sore throat, anorexia, diarrhea, HA, anosmia, decreased sense of taste  She had a lot of nasal congestion, SOB.  She was treated with Paxlovid.  Sense of smell is coming back.  Myalgias improved after Paxlovid but she developed new myalgias involving her legs which began in September 2023.    Saw PA at PCP's office for evaluation of bilateral hip and leg pain.  Left hip pain began a week prior.  Patient was instructed to begin meloxicam and methocarbamol without relief of symptoms.  June 20 2022:  Presented to urgent care with hip and leg pain.  Treated with steroid injections to both hips for presumed bursitis without benefit.  June 21 2022:  Began steroid dose pack without benefit.    July 10, 2022:  Seen at [facility].   MRI of the right hip showed a small hip joint effusion.  No muscle or tendon signal abnormalities.  Multiple and large internal and external iliac lymph nodes the largest measuring up to 1.6 cm in short axis and a 3.2 cm right ovarian cyst.   Presented to [facility] health emergency room with shortness of breath, dizziness, lightheadedness, WBC 5.3 hemoglobin 7.2 MCV 54 platelet count 342; 74 segs 16 lymphs 8 monos 2 eos.  Teardrops polychromasia and target cells noted.  Reticulocyte count 1.3% INR 1.1   CMP notable for sodium 134 potassium 3.2 creatinine 0.44 T. bili 0.4 B12 7450 folate 6.9 ferritin 3  Patient was placed on iron iron and potassium.  She was  referred to primary care physician  Other laboratories notable for CRP of 6 (upper limit of normal 10) CPK 502 (upper limit of normal 165)  July 13 2022:  [facility]  Patient is G2 P1011 Menopause not reached.  Menses occur monthly and regular and last 5 to 6 days.  Bleeding is moderate.  Does not have bleeding between periods.    Does not pass clots.  Last pregnancy was delivered by NSVD section in 2014.  No postpartum hemorrhage.    Patient does not have history of uterine fibroids/uterine abnormalities.  Has taken oral iron in the past.  Has not received IV iron/ required PRBC's in the past.  Has tolerated oral iron owing to GI intolerance.   Has a normal diet.  No hematochezia, melena, hemoptysis, hematuria.   No history of intra-articular or soft tissue bleeding  No history of abnormal bleeding in family members Patient has symptoms of fatigue, pallor,  DOE, decreased performance status.  Patient has pica to ice.    Social:  Married.  Works for [employer].  Tobacco none.  EtOH occasionally  [facility] Mother alive 57 Graves Disease, HTN Father alive 52 none that patient is aware of Brother alive 42 well Sister alive 22 well  Coombs test (IgG +/complement negative) no specific antibody demonstrating eluate.  Haptoglobin 213  Hemoglobin electrophoresis normal SPEP with IEP pending.  Serum free kappa 25.6, lambda 18.3 with a kappa lambda 1.40  ANA panel  negative.  RF negative.  C3 169 (elevated) C4 11 enthesis lower limit of normal 12) Lyme disease panel negative.  RPR negative.  CMV DNA PCR negative EBV DNA 52 by PCR.  Hepatitis ABC serologies negative.  HIV negative LDH 157 myoglobin 42 TSH/free T40.633/1.08. Pregnancy test negative  July 14 2022: CT chest abdomen pelvis.  Prominent/mildly enlarged lymph nodes above and below the diaphragm nonspecific.  No splenomegaly.  Mild symmetrical esophageal wall thickening.  Scattered colonic diverticulosis without  findings of acute diverticulitis.  Uterine leiomyomas.  3.1 cm right ovarian cyst considered benign  July 16 2022: Scheduled follow up for management of anemia and myositis.  Reviewed results of labs and CT scans with patient and her husband. She feels better overall.  Arthralgias and myalgias, fatigue have improved with Prednisone 50 mg daily (last dose yesterday) and Ibuprofen 800 mg tid.  She is taking a PPI and famotidine for GERD.  Sleeping better.  To begin IV iron today    July 23 2022:  Scheduled follow up for management of anemia and myositis.  Patient called on October 7 stating that she having severe myalgias having stopped prednisone on October 4.  I called in script for prednisone 50 mg daily which she started on July 19 2022.  Feels much better today.  Myalgias down to a 2/10 with help of tylenol and biofreze.  Sleeping off and on at night but to myalgias and arthralgias of hips.   Received second dose of IV iron today.   Pica improving; fatigue better.  Patient will decrease prednisone to 40 mg on July 26 2022  Ferritin 236 CK 72  August 06 2022:  Scheduled follow up for management of anemia and myositis  Reviewed results of labs from last visit.  Currently on Prednisone 40 mg daily Not having any myositis.  Not using any biofreze.  Sleeping well.  Not using heating pad.  Not fatigued  Decrease prednisone to 20 mg daily x 1 week then to 15 mg. Follow up in 2 weeks.   September 14 2022:  Follow up.  Telephone visit.  Off prednisone completely.  Pain resolved.   Menses have been irregular since COVID diagnosis.  No ice pica.     Review of Systems  Constitutional:  Positive for fatigue. Negative for chills, fever and unexpected weight change.  HENT:   Negative for mouth sores, nosebleeds, sore throat and trouble swallowing.   Eyes:  Negative for eye problems and icterus.  Respiratory:  Negative for chest tightness, cough, hemoptysis and shortness of breath.    Cardiovascular:  Negative for chest pain, leg swelling and palpitations.  Gastrointestinal:  Negative for abdominal pain, blood in stool, constipation, diarrhea and nausea.  Genitourinary:  Negative for difficulty urinating, dysuria and hematuria.   Musculoskeletal:  Positive for arthralgias and myalgias. Negative for gait problem.       Occasional lumbar pain Arthralgias and myalgias improving  Neurological:  Negative for gait problem, headaches, light-headedness and numbness.       Occasional dizziness  Hematological:  Negative for adenopathy. Does not bruise/bleed easily.  Psychiatric/Behavioral:  Negative for depression.        No longer awakes in the middle of the night.  Sleep improved    MEDICAL HISTORY: Past Medical History:  Diagnosis Date   GERD (gastroesophageal reflux disease)    Medical history non-contributory    Preterm labor    18 week SAB    SURGICAL HISTORY: Past Surgical History:  Procedure  Laterality Date   CERVICAL CERCLAGE N/A 03/21/2013   Procedure: CERCLAGE CERVICAL;  Surgeon: Lenoard Adenichard J Taavon, MD;  Location: [facility];  Service: Gynecology;  Laterality: N/A;  EDD: 09/25/13   DIAGNOSTIC LAPAROSCOPY     DILATION AND CURETTAGE OF UTERUS      SOCIAL HISTORY: Social History   Socioeconomic History   Marital status: Married    Spouse name: Molli HazardMatthew   Number of children: 1   Years of education: 14   Highest education level: Some college, no degree  Occupational History   Not on file  Tobacco Use   Smoking status: Never   Smokeless tobacco: Never  Vaping Use   Vaping Use: Never used  Substance and Sexual Activity   Alcohol use: No   Drug use: No   Sexual activity: Not Currently    Birth control/protection: None  Other Topics Concern   Not on file  Social History Narrative   Not on file   Social Determinants of Health   Financial Resource Strain: Not on file  Food Insecurity: Not on file  Transportation Needs: Not on file  Physical Activity:  Not on file  Stress: Not on file  Social Connections: Not on file  Intimate Partner Violence: Not on file    FAMILY HISTORY Family History  Problem Relation Age of Onset   Healthy Mother    Healthy Father     ALLERGIES:  has No Known Allergies.  MEDICATIONS:  Current Outpatient Medications  Medication Sig Dispense Refill   albuterol (VENTOLIN HFA) 108 (90 Base) MCG/ACT inhaler USE 1 TO 2 INHALATIONS BY MOUTH  EVERY 6 HOURS AS NEEDED FOR  WHEEZING OR SHORTNESS OF BREATH 26.8 g 0   famotidine (PEPCID) 40 MG tablet Take 40 mg by mouth daily.     ferrous sulfate 325 (65 FE) MG tablet Take one tablet a day for five days. If you are tolerating it, increase to twice a day. If after another five days you are still tolerating it, increase to three times a day. At any point if you develop intolerable side effects, go back to the lower dose. 90 tablet 0   montelukast (SINGULAIR) 10 MG tablet Take 1 tablet (10 mg total) by mouth at bedtime. 30 tablet 11   omeprazole (PRILOSEC) 20 MG capsule Take 20 mg by mouth daily.     potassium chloride SA (KLOR-CON M) 20 MEQ tablet Take 1 tablet (20 mEq total) by mouth 2 (two) times daily. 10 tablet 0   predniSONE (DELTASONE) 10 MG tablet Take 40 mg by mouth daily.     SYMBICORT 80-4.5 MCG/ACT inhaler Inhale 2 puffs into the lungs in the morning and at bedtime.     No current facility-administered medications for this visit.    PHYSICAL EXAMINATION:  ECOG PERFORMANCE STATUS: 1 - Symptomatic but completely ambulatory   There were no vitals filed for this visit.   There were no vitals filed for this visit.    Physical Exam Vitals and nursing note reviewed.  Constitutional:      General: She is not in acute distress.    Appearance: Normal appearance. She is not ill-appearing, toxic-appearing or diaphoretic.     Comments: Here alone.  Comfortable  HENT:     Head: Normocephalic and atraumatic.     Right Ear: External ear normal.     Left Ear:  External ear normal.     Nose: Nose normal. No congestion or rhinorrhea.  Eyes:     General:  No scleral icterus.    Extraocular Movements: Extraocular movements intact.     Conjunctiva/sclera: Conjunctivae normal.     Pupils: Pupils are equal, round, and reactive to light.  Cardiovascular:     Rate and Rhythm: Normal rate and regular rhythm.     Heart sounds: Normal heart sounds. No murmur heard.    No friction rub. No gallop.  Pulmonary:     Effort: Pulmonary effort is normal. No respiratory distress.     Breath sounds: Normal breath sounds. No stridor. No wheezing, rhonchi or rales.  Chest:     Chest wall: No tenderness.  Abdominal:     General: Bowel sounds are normal. There is no distension.     Palpations: Abdomen is soft. There is no mass.     Tenderness: There is no abdominal tenderness. There is no rebound.  Musculoskeletal:        General: No swelling, tenderness or deformity.     Cervical back: Normal range of motion and neck supple. No rigidity or tenderness.  Lymphadenopathy:     Head:     Right side of head: No submental, submandibular, tonsillar, preauricular, posterior auricular or occipital adenopathy.     Left side of head: No submental, submandibular, tonsillar, preauricular, posterior auricular or occipital adenopathy.     Cervical: No cervical adenopathy.     Right cervical: No superficial, deep or posterior cervical adenopathy.    Left cervical: No superficial, deep or posterior cervical adenopathy.     Upper Body:     Right upper body: No supraclavicular, axillary, pectoral or epitrochlear adenopathy.     Left upper body: No supraclavicular, axillary, pectoral or epitrochlear adenopathy.  Skin:    General: Skin is warm.     Coloration: Skin is not jaundiced or pale.     Findings: No bruising or erythema.  Neurological:     General: No focal deficit present.     Mental Status: She is alert and oriented to person, place, and time. Mental status is at  baseline.     Cranial Nerves: No cranial nerve deficit.  Psychiatric:        Mood and Affect: Mood normal.        Behavior: Behavior normal.        Thought Content: Thought content normal.        Judgment: Judgment normal.    LABORATORY DATA: I have personally reviewed the data as listed:  No visits with results within 1 Month(s) from this visit.  Latest known visit with results is:  Office Visit on 08/06/2022  Component Date Value Ref Range Status   Coagulation Factor VIII 08/06/2022 194 (H)  56 - 140 % Final   Comment: (NOTE) FVIII activity can increase in a variety of clinical situations including normal pregnancy, in samples drawn from patients (particularly children) who are visibly stressed at the time of phlebotomy, as acute phase reactants, or in response to certain drug therapies such as DDAVP.  Persistently elevated FVIII activity is a risk factor for venous thrombosis as well as recurrence of venous thrombosis.  Risk is graded and increases with the degree of elevation.  Although elevated FVIII activity has been identified to cluster within families, a genetic basis for the elevation has not yet been elucidated (Br J Haematol. 2012; 283:662-947).    Ristocetin Co-factor, Plasma 08/06/2022 151  50 - 200 % Final   Comment: (NOTE) Performed At: [facility] [address] [place], [place] [id number] Jolene Schimke MD  WU:[id number]    Von Willebrand Antigen, Plasma 08/06/2022 276 (H)  50 - 200 % Final   Comment: (NOTE) VWF may elevate in normal pregnancy, in samples drawn from patients (particularly children) who are visibly stressed at the time of phlebotomy, as acute phase reactants, or in response to certain drug therapies such as DDAVP.  These and other situations may increase levels compared to baseline values.  For these reasons, it may be necessary to repeat testing to make a diagnosis of VWD. This test was developed and its performance  characteristics determined by [facility]. It has not been cleared or approved by the Food and Drug Administration.    Interpretation 08/06/2022 Note   Final   Comment: (NOTE) ------------------------------- COAGULATION: VON WILLEBRAND FACTOR ASSESSMENT CURRENT RESULTS ASSESSMENT The VWF:Ag is elevated. The VWF:RCo is normal. The FVIII is elevated. VON WILLEBRAND FACTOR ASSESSMENT CURRENT RESULTS INTERPRETATION - These results are not consistent with a diagnosis of VWD according to the current NHLBI guideline. Persistently elevated FVIII activity is a risk factor for venous thrombosis as well as recurrence of venous thrombosis. Risk is graded and increases with the degree of elevation. Although elevated FVIII activity has been identified to cluster within families, a genetic basis for the elevation has not yet been elucidated (Br J Haematol. 2012; 157(6):653-663). VON WILLEBRAND FACTOR ASSESSMENT - VWF and FVIII may elevate as compared to baseline in pregnancy, in samples drawn from patients (particularly children) who are visibly stressed at the time of phlebotomy, as acute phase reactants, or in response to certain drug thera                          pies such as desmopressin and estrogen. Repeat testing may be necessary before excluding a diagnosis of VWD especially if the clinical suspicion is high for an underlying bleeding disorder. The setting for phlebotomy should be as calm as possible and patients should be encouraged to sit quietly prior to the blood draw. VON WILLEBRAND FACTOR ASSESSMENT DEFINITIONS - VWD - von Willebrand disease; VWF - von Willebrand factor; VWF:Ag - VWF antigen; VWF:RCo - VWF ristocetin cofactor activity; FVIII - factor VIII activity. MEDICAL DIRECTOR: For questions regarding panel interpretation, please contact Lebron Conners, M.D. at [facility] at [phone]. ------------------------------- DISCLAIMER These assessments  and interpretations are provided as a convenience in support of the physician-patient relationship and are not intended to replace the physician's clinical judgment. They are derived from national guidelines in addition to other evidence and exp                          ert opinion. The clinician should consider this information within the context of clinical opinion and the individual patient. SEE GUIDANCE FOR VON WILLEBRAND FACTOR ASSESSMENT: (1) The National Heart, Lung and Blood Institute. The Diagnosis, Evaluation and Management of von Willebrand Disease. Lafe Garin, MD: [employer] Publication [phone]. 2007. Available at http://kemp.com/. (2) Annie Sable et al. Othella Boyer J Hematol. 2009; 84(6):366-370. (3) Laffan M et al. Haemophilia. 2004;10(3):199-217. (4) Pasi KJ et al. Haemophilia. 2004; 10(3):218-231. Performed At: [facility] [address] [place], [place] [id number] Blanchie Serve MD MW:[id number]     RADIOGRAPHIC STUDIES: I have personally reviewed the radiological images as listed and agree with the findings in the report  No results found.  ASSESSMENT/PLAN  Patient is a 41 year old female with symptomatic microcytic anemia presumed to be secondary to  dysfunctional uterine bleeding.  Patient has also developed myalgias    Anemia:  Due to iron deficiency anemia owing to dysfunctional uterine bleeding/ exacerbated by high demand owing to prior pregnancies.  Consider future GI referral for pan endoscopy  Dysfunctional uterine bleeding:  This is characterized by menses that are prolonged, irregular as well as by heavy bleeding with passage of clots.  Will need to evaluate for possible bleeding disorder with PT, PTT, Fibrinogen, von Willebrand screen.    Therapeutics:  Since patient had symptomatic anemia with Hgb < 10  and had not tolerated oral iron received IV iron replacement.     Octpber 5 2023 :  Feraheme 510 mg    July 23 2022:  Feraheme 510 mg     Lymphadenopathy:  Was noted on MRI femoral region performed on July 10 2022.  CT CAP on July 14 2022 demonstrated mild generalized adenopathy.   This is a common finding in the setting of COVID 19 infections.  Rheumatology serologies negative.  HIV, Hepatitis serologies, RPR and Lyme serologies negative.  CMV PCR negative.  EBV PCR mildly elevated but this may be secondary to steroids.  EBV reactivation can be seen in the setting of COVID 19.    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  Timing is consistent.  Rheumatology markers negative.  Improved on steroids.  Since symptoms rebounded with abrupt d/c of prednisone restarted it at 50 mg daily  July 23 2022:  Decreased prednisone to 40 mg daily August 06 2022:  Decrease prednisone to  20 mg daily x 1 week then to 15 mg.    Cancer Staging  No matching staging information was found for the patient.   No problem-specific Assessment & Plan notes found for this encounter.   No orders of the defined types were placed in this encounter.   All questions were answered. The patient knows to call the clinic with any problems, questions or concerns.  This note was electronically signed.    Loni Muse, MD  09/14/2022 8:47 AM

## 2022-09-15 ENCOUNTER — Telehealth: Payer: Self-pay | Admitting: Oncology

## 2022-09-15 NOTE — Telephone Encounter (Signed)
Contacted pt to schedule an appt per 09/14/22. Unable to reach via phone, voicemail was left.    Follow-Up Information  Follow-up disposition: Return in about 2 months (around 11/15/2022) for physician, labs.  Check out comments: Labs 1 week before visit

## 2022-10-30 ENCOUNTER — Other Ambulatory Visit: Payer: Self-pay | Admitting: Pulmonary Disease

## 2022-10-30 ENCOUNTER — Inpatient Hospital Stay: Payer: No Typology Code available for payment source | Attending: Oncology

## 2022-10-30 ENCOUNTER — Other Ambulatory Visit: Payer: Self-pay

## 2022-10-30 DIAGNOSIS — D500 Iron deficiency anemia secondary to blood loss (chronic): Secondary | ICD-10-CM

## 2022-10-30 DIAGNOSIS — N921 Excessive and frequent menstruation with irregular cycle: Secondary | ICD-10-CM | POA: Diagnosis present

## 2022-10-30 LAB — CBC WITH DIFFERENTIAL (CANCER CENTER ONLY)
Abs Immature Granulocytes: 0.01 10*3/uL (ref 0.00–0.07)
Basophils Absolute: 0 10*3/uL (ref 0.0–0.1)
Basophils Relative: 1 %
Eosinophils Absolute: 0.2 10*3/uL (ref 0.0–0.5)
Eosinophils Relative: 6 %
HCT: 39.6 % (ref 36.0–46.0)
Hemoglobin: 11.4 g/dL — ABNORMAL LOW (ref 12.0–15.0)
Immature Granulocytes: 0 %
Lymphocytes Relative: 27 %
Lymphs Abs: 0.8 10*3/uL (ref 0.7–4.0)
MCH: 20.2 pg — ABNORMAL LOW (ref 26.0–34.0)
MCHC: 28.8 g/dL — ABNORMAL LOW (ref 30.0–36.0)
MCV: 70.3 fL — ABNORMAL LOW (ref 80.0–100.0)
Monocytes Absolute: 0.5 10*3/uL (ref 0.1–1.0)
Monocytes Relative: 16 %
Neutro Abs: 1.5 10*3/uL — ABNORMAL LOW (ref 1.7–7.7)
Neutrophils Relative %: 50 %
Platelet Count: 289 10*3/uL (ref 150–400)
RBC: 5.63 MIL/uL — ABNORMAL HIGH (ref 3.87–5.11)
RDW: 17.1 % — ABNORMAL HIGH (ref 11.5–15.5)
WBC Count: 2.9 10*3/uL — ABNORMAL LOW (ref 4.0–10.5)
nRBC: 0 % (ref 0.0–0.2)

## 2022-10-30 LAB — FERRITIN: Ferritin: 7 ng/mL — ABNORMAL LOW (ref 11–307)

## 2022-11-09 ENCOUNTER — Inpatient Hospital Stay (INDEPENDENT_AMBULATORY_CARE_PROVIDER_SITE_OTHER): Payer: No Typology Code available for payment source | Admitting: Oncology

## 2022-11-09 DIAGNOSIS — U099 Post covid-19 condition, unspecified: Secondary | ICD-10-CM

## 2022-11-09 DIAGNOSIS — D500 Iron deficiency anemia secondary to blood loss (chronic): Secondary | ICD-10-CM

## 2022-11-09 DIAGNOSIS — Z7189 Other specified counseling: Secondary | ICD-10-CM

## 2022-11-09 NOTE — Progress Notes (Unsigned)
[facility] Follow up Visit:  Patient Care Team: Carol Ada, MD as PCP - General (Family Medicine)  CHIEF COMPLAINTS/PURPOSE OF CONSULTATION:  Oncology History   No history exists.    HISTORY OF PRESENTING ILLNESS: Tara Farrell 42 y.o. female is here because of anemia, myalgias, adenopathy.    September 09/2013: WBC 8.5 hemoglobin 11.4 MCV 69 platelet count 242   June 18, 2022:  Patient had COVID 19 in August 2023.  Her symptoms included fatigue, generalized myalgias, sore throat, anorexia, diarrhea, HA, anosmia, decreased sense of taste  She had a lot of nasal congestion, SOB.  She was treated with Paxlovid.  Sense of smell is coming back.  Myalgias improved after Paxlovid but she developed new myalgias involving her legs which began in September 2023.    Saw PA at PCP's office for evaluation of bilateral hip and leg pain.  Left hip pain began a week prior.  Patient was instructed to begin meloxicam and methocarbamol without relief of symptoms.  June 20 2022:  Presented to urgent care with hip and leg pain.  Treated with steroid injections to both hips for presumed bursitis without benefit.  June 21 2022:  Began steroid dose pack without benefit.    July 10, 2022:  Seen at [facility].   MRI of the right hip showed a small hip joint effusion.  No muscle or tendon signal abnormalities.  Multiple and large internal and external iliac lymph nodes the largest measuring up to 1.6 cm in short axis and a 3.2 cm right ovarian cyst.   Presented to [facility] health emergency room with shortness of breath, dizziness, lightheadedness, WBC 5.3 hemoglobin 7.2 MCV 54 platelet count 342; 74 segs 16 lymphs 8 monos 2 eos.  Teardrops polychromasia and target cells noted.  Reticulocyte count 1.3% INR 1.1   CMP notable for sodium 134 potassium 3.2 creatinine 0.44 T. bili 0.4 B12 7450 folate 6.9 ferritin 3  Patient was placed on iron iron and potassium.  She was  referred to primary care physician  Other laboratories notable for CRP of 6 (upper limit of normal 10) CPK 502 (upper limit of normal 165)  July 13 2022:  [facility]  Patient is G2 P1011 Menopause not reached.  Menses occur monthly and regular and last 5 to 6 days.  Bleeding is moderate.  Does not have bleeding between periods.    Does not pass clots.  Last pregnancy was delivered by NSVD section in 2014.  No postpartum hemorrhage.    Patient does not have history of uterine fibroids/uterine abnormalities.  Has taken oral iron in the past.  Has not received IV iron/ required PRBC's in the past.  Has tolerated oral iron owing to GI intolerance.   Has a normal diet.  No hematochezia, melena, hemoptysis, hematuria.   No history of intra-articular or soft tissue bleeding  No history of abnormal bleeding in family members Patient has symptoms of fatigue, pallor,  DOE, decreased performance status.  Patient has pica to ice.    Social:  Married.  Works for [employer].  Tobacco none.  EtOH occasionally  [facility] Mother alive 57 Graves Disease, HTN Father alive 52 none that patient is aware of Brother alive 42 well Sister alive 22 well  Coombs test (IgG +/complement negative) no specific antibody demonstrating eluate.  Haptoglobin 213  Hemoglobin electrophoresis normal SPEP with IEP pending.  Serum free kappa 25.6, lambda 18.3 with a kappa lambda 1.40  ANA panel  negative.  RF negative.  C3 169 (elevated) C4 11 enthesis lower limit of normal 12) Lyme disease panel negative.  RPR negative.  CMV DNA PCR negative EBV DNA 52 by PCR.  Hepatitis ABC serologies negative.  HIV negative LDH 157 myoglobin 42 TSH/free T40.633/1.08. Pregnancy test negative  July 14 2022: CT chest abdomen pelvis.  Prominent/mildly enlarged lymph nodes above and below the diaphragm nonspecific.  No splenomegaly.  Mild symmetrical esophageal wall thickening.  Scattered colonic diverticulosis without  findings of acute diverticulitis.  Uterine leiomyomas.  3.1 cm right ovarian cyst considered benign  July 16 2022: Scheduled follow up for management of anemia and myositis.  Reviewed results of labs and CT scans with patient and her husband. She feels better overall.  Arthralgias and myalgias, fatigue have improved with Prednisone 50 mg daily (last dose yesterday) and Ibuprofen 800 mg tid.  She is taking a PPI and famotidine for GERD.  Sleeping better.  To begin IV iron today    July 23 2022:  Scheduled follow up for management of anemia and myositis.  Patient called on October 7 stating that she having severe myalgias having stopped prednisone on October 4.  I called in script for prednisone 50 mg daily which she started on July 19 2022.  Feels much better today.  Myalgias down to a 2/10 with help of tylenol and biofreze.  Sleeping off and on at night but to myalgias and arthralgias of hips.   Received second dose of IV iron today.   Pica improving; fatigue better.  Patient will decrease prednisone to 40 mg on July 26 2022  Ferritin 236 CK 72  August 06 2022:  Scheduled follow up for management of anemia and myositis  Reviewed results of labs from last visit.  Currently on Prednisone 40 mg daily Not having any myositis.  Not using any biofreze.  Sleeping well.  Not using heating pad.  Not fatigued  Decrease prednisone to 20 mg daily x 1 week then to 15 mg. Follow up in 2 weeks.   September 14 2022:  Follow up.  Telephone visit.  Off prednisone completely.  Pain resolved.   Menses have been irregular since COVID diagnosis.  No ice pica.    October 30, 2022: WBC 2.9 hemoglobin 11.4 MCV 70 platelet count 289; 50 segs 27 lymphs 16 monos 6 eos 1 basophil.  ferritin 7  November 09 2022:  Scheduled follow up for management of anemia.  Telephone visit.  Taking LoFe one tablet daily which she is tolerating well.  Ice pica returned a few weeks ago.  Has not been back to gynecologist.  Menses have  not been regular with last period on October 04 2023.   Consider testing for alpha thalassemia  Review of Systems  Constitutional:  Positive for fatigue. Negative for chills, fever and unexpected weight change.  HENT:   Negative for mouth sores, nosebleeds, sore throat and trouble swallowing.   Eyes:  Negative for eye problems and icterus.  Respiratory:  Negative for chest tightness, cough, hemoptysis and shortness of breath.   Cardiovascular:  Negative for chest pain, leg swelling and palpitations.  Gastrointestinal:  Negative for abdominal pain, blood in stool, constipation, diarrhea and nausea.  Genitourinary:  Negative for difficulty urinating, dysuria and hematuria.   Musculoskeletal:  Positive for arthralgias and myalgias. Negative for gait problem.       Occasional lumbar pain Arthralgias and myalgias improving  Neurological:  Negative for gait problem, headaches, light-headedness and numbness.  Occasional dizziness  Hematological:  Negative for adenopathy. Does not bruise/bleed easily.  Psychiatric/Behavioral:  Negative for depression.        No longer awakes in the middle of the night.  Sleep improved    MEDICAL HISTORY: Past Medical History:  Diagnosis Date   GERD (gastroesophageal reflux disease)    Medical history non-contributory    Preterm labor    18 week SAB    SURGICAL HISTORY: Past Surgical History:  Procedure Laterality Date   CERVICAL CERCLAGE N/A 03/21/2013   Procedure: CERCLAGE CERVICAL;  Surgeon: Lovenia Kim, MD;  Location: [facility];  Service: Gynecology;  Laterality: N/A;  EDD: 09/25/13   DIAGNOSTIC LAPAROSCOPY     DILATION AND CURETTAGE OF UTERUS      SOCIAL HISTORY: Social History   Socioeconomic History   Marital status: Married    Spouse name: Rodman Key   Number of children: 1   Years of education: 14   Highest education level: Some college, no degree  Occupational History   Not on file  Tobacco Use   Smoking status: Never    Smokeless tobacco: Never  Vaping Use   Vaping Use: Never used  Substance and Sexual Activity   Alcohol use: No   Drug use: No   Sexual activity: Not Currently    Birth control/protection: None  Other Topics Concern   Not on file  Social History Narrative   Not on file   Social Determinants of Health   Financial Resource Strain: Not on file  Food Insecurity: Not on file  Transportation Needs: Not on file  Physical Activity: Not on file  Stress: Not on file  Social Connections: Not on file  Intimate Partner Violence: Not on file    FAMILY HISTORY Family History  Problem Relation Age of Onset   Healthy Mother    Healthy Father     ALLERGIES:  has No Known Allergies.  MEDICATIONS:  Current Outpatient Medications  Medication Sig Dispense Refill   albuterol (VENTOLIN HFA) 108 (90 Base) MCG/ACT inhaler USE 1 TO 2 INHALATIONS BY MOUTH  EVERY 6 HOURS AS NEEDED FOR  WHEEZING OR SHORTNESS OF BREATH 26.8 g 0   famotidine (PEPCID) 40 MG tablet Take 40 mg by mouth daily.     ferrous sulfate 325 (65 FE) MG tablet Take one tablet a day for five days. If you are tolerating it, increase to twice a day. If after another five days you are still tolerating it, increase to three times a day. At any point if you develop intolerable side effects, go back to the lower dose. 90 tablet 0   montelukast (SINGULAIR) 10 MG tablet Take 1 tablet (10 mg total) by mouth at bedtime. 30 tablet 11   omeprazole (PRILOSEC) 20 MG capsule Take 20 mg by mouth daily.     potassium chloride SA (KLOR-CON M) 20 MEQ tablet Take 1 tablet (20 mEq total) by mouth 2 (two) times daily. 10 tablet 0   predniSONE (DELTASONE) 10 MG tablet Take 40 mg by mouth daily.     SYMBICORT 80-4.5 MCG/ACT inhaler Inhale 2 puffs into the lungs in the morning and at bedtime.     No current facility-administered medications for this visit.    PHYSICAL EXAMINATION:  ECOG PERFORMANCE STATUS: 1 - Symptomatic but completely  ambulatory   There were no vitals filed for this visit.   There were no vitals filed for this visit.  No physical exam could be conducted as part of a  telephone visit   Physical Exam  LABORATORY DATA: I have personally reviewed the data as listed:  Appointment on 10/30/2022  Component Date Value Ref Range Status   Ferritin 10/30/2022 7 (L)  11 - 307 ng/mL Final   Performed at [facility], [address] [place], [place] [address]   WBC Count 10/30/2022 2.9 (L)  4.0 - 10.5 K/uL Final   RBC 10/30/2022 5.63 (H)  3.87 - 5.11 MIL/uL Final   Hemoglobin 10/30/2022 11.4 (L)  12.0 - 15.0 g/dL Final   HCT 80/99/8338 39.6  36.0 - 46.0 % Final   MCV 10/30/2022 70.3 (L)  80.0 - 100.0 fL Final   MCH 10/30/2022 20.2 (L)  26.0 - 34.0 pg Final   MCHC 10/30/2022 28.8 (L)  30.0 - 36.0 g/dL Final   RDW 25/02/3975 17.1 (H)  11.5 - 15.5 % Final   Platelet Count 10/30/2022 289  150 - 400 K/uL Final   nRBC 10/30/2022 0.0  0.0 - 0.2 % Final   Neutrophils Relative % 10/30/2022 50  % Final   Neutro Abs 10/30/2022 1.5 (L)  1.7 - 7.7 K/uL Final   Lymphocytes Relative 10/30/2022 27  % Final   Lymphs Abs 10/30/2022 0.8  0.7 - 4.0 K/uL Final   Monocytes Relative 10/30/2022 16  % Final   Monocytes Absolute 10/30/2022 0.5  0.1 - 1.0 K/uL Final   Eosinophils Relative 10/30/2022 6  % Final   Eosinophils Absolute 10/30/2022 0.2  0.0 - 0.5 K/uL Final   Basophils Relative 10/30/2022 1  % Final   Basophils Absolute 10/30/2022 0.0  0.0 - 0.1 K/uL Final   Immature Granulocytes 10/30/2022 0  % Final   Abs Immature Granulocytes 10/30/2022 0.01  0.00 - 0.07 K/uL Final   Performed at [facility], [address], [place], [place] [address]    RADIOGRAPHIC STUDIES: I have personally reviewed the radiological images as listed and agree with the findings in the report  No results found.  ASSESSMENT/PLAN  Patient is a 42 year old female with symptomatic microcytic anemia  presumed to be secondary to dysfunctional uterine bleeding.  Patient has also developed myalgias    Anemia:  Due to iron deficiency anemia owing to dysfunctional uterine bleeding/ exacerbated by high demand owing to prior pregnancies.  Consider future GI referral for pan endoscopy  Dysfunctional uterine bleeding:  This is characterized by menses that are prolonged, irregular as well as by heavy bleeding with passage of clots.   November 09 2022:  No longer with heavy menstrual bleeding.  Will hold on evaluation for possible bleeding disorder with PT, PTT, Fibrinogen, von Willebrand screen.    Therapeutics:  Since patient had symptomatic anemia with Hgb < 10  and had not tolerated oral iron received IV iron replacement.     Octpber 5 2023 :  Feraheme 510 mg   July 23 2022:  Feraheme 510 mg   November 09 2022:   Ferritin 7 despite oral iron and decreased menses.  Referring to GI Arranging for additional IV iron    Lymphadenopathy:  Was noted on MRI femoral region performed on July 10 2022.  CT CAP on July 14 2022 demonstrated mild generalized adenopathy.   This is a common finding in the setting of COVID 19 infections.  Rheumatology serologies negative.  HIV, Hepatitis serologies, RPR and Lyme serologies negative.  CMV PCR negative.  EBV PCR mildly elevated but this may be secondary to steroids.  EBV reactivation can  be seen in the setting of COVID 19.    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  Timing is consistent.  Rheumatology markers negative.  Improved on steroids.  Since symptoms rebounded with abrupt d/c of prednisone restarted it at 50 mg daily  July 23 2022:  Decreased prednisone to 40 mg daily August 06 2022:  Decrease prednisone to  20 mg daily x 1 week then to 15 mg. September 14 2022:  Steroid taper complete.  Patient without rebound symptoms.   November 09 2022:  Remains asymptomatic off steroids   Cancer Staging  No matching staging information was found for the  patient.   No problem-specific Assessment & Plan notes found for this encounter.   No orders of the defined types were placed in this encounter.  21  minutes was spent in patient care.  This included time spent preparing to see the patient (e.g., review of tests), obtaining and/or reviewing separately obtained history, counseling and educating the patient ordering medications, or procedures; documenting clinical information in the electronic or other health record, independently interpreting results and communicating results to the patient as well as coordination of care.       All questions were answered. The patient knows to call the clinic with any problems, questions or concerns.  This note was electronically signed.    Loni Muse, MD  11/09/2022 12:57 PM

## 2022-11-10 ENCOUNTER — Telehealth: Payer: Self-pay | Admitting: Oncology

## 2022-11-10 NOTE — Telephone Encounter (Signed)
Contacted pt to schedule appts per 11/09/22 LOS. Unable to reach via phone, voicemail was left.     Follow-Up Information  Follow-up disposition: Return in about 3 months (around 02/08/2023) for physician, labs.  Check out comments: Please refer to GI Please schedule for IV iron

## 2022-11-10 NOTE — Telephone Encounter (Signed)
11/10/22 Spoke with patient and scheduled IV Iron.

## 2022-11-11 ENCOUNTER — Encounter: Payer: Self-pay | Admitting: Oncology

## 2022-11-26 MED FILL — Ferumoxytol Inj 510 MG/17ML (30 MG/ML) (Elemental Fe): INTRAVENOUS | Qty: 17 | Status: AC

## 2022-11-27 ENCOUNTER — Inpatient Hospital Stay: Payer: No Typology Code available for payment source | Attending: Oncology

## 2022-11-27 VITALS — BP 141/84 | HR 85 | Temp 98.0°F | Resp 16 | Ht 64.6 in | Wt 270.1 lb

## 2022-11-27 DIAGNOSIS — N921 Excessive and frequent menstruation with irregular cycle: Secondary | ICD-10-CM | POA: Diagnosis present

## 2022-11-27 DIAGNOSIS — D500 Iron deficiency anemia secondary to blood loss (chronic): Secondary | ICD-10-CM | POA: Diagnosis present

## 2022-11-27 MED ORDER — ACETAMINOPHEN 325 MG PO TABS
650.0000 mg | ORAL_TABLET | Freq: Once | ORAL | Status: AC
  Administered 2022-11-27: 650 mg via ORAL
  Filled 2022-11-27: qty 2

## 2022-11-27 MED ORDER — SODIUM CHLORIDE 0.9 % IV SOLN
510.0000 mg | Freq: Once | INTRAVENOUS | Status: AC
  Administered 2022-11-27: 510 mg via INTRAVENOUS
  Filled 2022-11-27: qty 510

## 2022-11-27 MED ORDER — LORATADINE 10 MG PO TABS
10.0000 mg | ORAL_TABLET | Freq: Once | ORAL | Status: AC
  Administered 2022-11-27: 10 mg via ORAL
  Filled 2022-11-27: qty 1

## 2022-11-27 MED ORDER — SODIUM CHLORIDE 0.9 % IV SOLN: Freq: Once | INTRAVENOUS | Status: AC

## 2022-11-27 NOTE — Patient Instructions (Signed)

## 2022-12-02 MED FILL — Ferumoxytol Inj 510 MG/17ML (30 MG/ML) (Elemental Fe): INTRAVENOUS | Qty: 17 | Status: AC

## 2022-12-03 ENCOUNTER — Inpatient Hospital Stay: Payer: No Typology Code available for payment source

## 2022-12-03 VITALS — BP 145/74 | HR 81 | Temp 98.0°F | Resp 18

## 2022-12-03 DIAGNOSIS — D500 Iron deficiency anemia secondary to blood loss (chronic): Secondary | ICD-10-CM | POA: Diagnosis not present

## 2022-12-03 MED ORDER — ACETAMINOPHEN 325 MG PO TABS
650.0000 mg | ORAL_TABLET | Freq: Once | ORAL | Status: AC
  Administered 2022-12-03: 650 mg via ORAL
  Filled 2022-12-03: qty 2

## 2022-12-03 MED ORDER — SODIUM CHLORIDE 0.9 % IV SOLN
510.0000 mg | Freq: Once | INTRAVENOUS | Status: AC
  Administered 2022-12-03: 510 mg via INTRAVENOUS
  Filled 2022-12-03: qty 17

## 2022-12-03 MED ORDER — SODIUM CHLORIDE 0.9 % IV SOLN: Freq: Once | INTRAVENOUS | Status: AC

## 2022-12-03 MED ORDER — LORATADINE 10 MG PO TABS
10.0000 mg | ORAL_TABLET | Freq: Once | ORAL | Status: AC
  Administered 2022-12-03: 10 mg via ORAL
  Filled 2022-12-03: qty 1

## 2022-12-03 NOTE — Patient Instructions (Signed)

## 2022-12-24 ENCOUNTER — Encounter: Payer: Self-pay | Admitting: Oncology

## 2022-12-25 ENCOUNTER — Other Ambulatory Visit: Payer: Self-pay

## 2022-12-25 DIAGNOSIS — D500 Iron deficiency anemia secondary to blood loss (chronic): Secondary | ICD-10-CM

## 2023-09-07 ENCOUNTER — Ambulatory Visit: Payer: No Typology Code available for payment source | Admitting: Nurse Practitioner

## 2023-09-21 ENCOUNTER — Ambulatory Visit: Payer: No Typology Code available for payment source | Admitting: Adult Health

## 2023-09-21 ENCOUNTER — Ambulatory Visit
Admission: RE | Admit: 2023-09-21 | Discharge: 2023-09-21 | Disposition: A | Discharge status: Home / Self Care | Payer: No Typology Code available for payment source | Source: Ambulatory Visit | Attending: Adult Health | Admitting: Adult Health

## 2023-09-21 ENCOUNTER — Encounter: Payer: Self-pay | Admitting: Adult Health

## 2023-09-21 ENCOUNTER — Ambulatory Visit
Admission: RE | Admit: 2023-09-21 | Discharge: 2023-09-21 | Disposition: A | Discharge status: Home / Self Care | Payer: No Typology Code available for payment source | Source: Ambulatory Visit | Attending: Adult Health | Admitting: *Deleted

## 2023-09-21 VITALS — BP 132/86 | HR 94 | Temp 97.5°F | Ht 64.5 in | Wt 276.0 lb

## 2023-09-21 DIAGNOSIS — R0683 Snoring: Secondary | ICD-10-CM | POA: Diagnosis not present

## 2023-09-21 DIAGNOSIS — J45909 Unspecified asthma, uncomplicated: Secondary | ICD-10-CM | POA: Insufficient documentation

## 2023-09-21 DIAGNOSIS — J4530 Mild persistent asthma, uncomplicated: Secondary | ICD-10-CM

## 2023-09-21 MED ORDER — ALBUTEROL SULFATE HFA 108 (90 BASE) MCG/ACT IN AERS: 1.0000 | INHALATION_SPRAY | RESPIRATORY_TRACT | 3 refills | Status: DC | PRN

## 2023-09-21 MED ORDER — FLUTICASONE FUROATE-VILANTEROL 100-25 MCG/ACT IN AEPB: 1.0000 | INHALATION_SPRAY | Freq: Every day | RESPIRATORY_TRACT | 5 refills | Status: DC

## 2023-09-21 MED ORDER — MONTELUKAST SODIUM 10 MG PO TABS: 10.0000 mg | ORAL_TABLET | Freq: Every day | ORAL | 11 refills | Status: DC

## 2023-09-21 MED ORDER — ALBUTEROL SULFATE (2.5 MG/3ML) 0.083% IN NEBU
2.5000 mg | INHALATION_SOLUTION | Freq: Once | RESPIRATORY_TRACT | Status: AC
  Administered 2023-09-21: 2.5 mg via RESPIRATORY_TRACT

## 2023-09-21 NOTE — Patient Instructions (Addendum)
Begin BREO 1 puff daily, rinse after use Albuterol inhaler As needed   Restart Singulair daily  Continue on Claritin daily .  Check chest xray today .   Set up for home sleep study.  Work on healthy weight loss  Do not drive if sleepy   Follow up in 3 months with Dr. Francine Graven or Tahir Blank NP and As needed   Please contact office for sooner follow up if symptoms do not improve or worsen or seek emergency care

## 2023-09-21 NOTE — Progress Notes (Signed)
@Patient  ID: Tara Farrell, female    DOB: 1981/10/11, 42 y.o.   MRN: [id number]  Chief Complaint  Patient presents with   Follow-up  Discussed the use of AI scribe software for clinical note transcription with the patient, who gave verbal consent to proceed.  Referring provider: Merri Brunette, MD  HPI: 42 year old female never smoker followed for asthma Medical history significant for microcytic anemia secondary dysfunctional uterine bleeding followed by hematology  TEST/EVENTS :  PFTs December 12, 2021 showed FEV1 85%, ratio 89, FVC 79%, DLCO 67%.  CT chest July 14, 2022 showed no suspicious pulmonary nodules, mildly enlarged lymph nodes above and below the diaphragm suspicious for lymphoproliferative disorder  09/21/2023 Follow up : Asthma  Patient returns for a follow-up visit for asthma.  Patient was last seen March 2023.  At that visit patient was recommended to continue on Symbicort unfortunately insurance would not cover it patient says she did take this for a while but felt that she got hives from this so stopped taking it.  She has been using her albuterol couple times a day. Is out of her Singulair . Started taking claritin this week.  Since discontinuation, she has not been on any inhaler and reports increased coughing, wheezing, and tightness. The patient notes that these symptoms are exacerbated by heat, and she frequently experiences shortness of breath, even while sitting at her desk. Weather changes, particularly cold and rainy weather, also seem to worsen her asthma symptoms.  Last visit was having some symptoms suspicious for sleep apnea with snoring and daytime sleepiness.  She was set up for home sleep study but this unfortunately was not completed.  Works 2 jobs day shift 8-5 , 3 days a week 10 pm -6am.  Says she still feels tired all the time.  Has loud snoring.  Patient did have an abnormal CT chest July 14, 2022 that showed lymphadenopathy.  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  Was seen by hematology/oncology.  Workup showed rheumatology serology was negative, HIV, hepatitis, RPR and Lyme serologies were negative. She was treated with slow steroid taper for the myositis. She has finished steroids and iron infusions. Is feeling better.    The patient also reports having reflux, for which she takes omeprazole and famotidine. .      The patient, with a history of asthma, presents with worsening symptoms since July of this year. She was previously on Symbicort, which was discontinued due to the development of hives. Since discontinuation, she has not been on any inhaler and reports increased coughing, wheezing, and tightness. The patient notes that these symptoms are exacerbated by heat, and she frequently experiences shortness of breath, even while sitting at her desk. Weather changes, particularly cold and rainy weather, also seem to worsen her asthma symptoms.  The patient has been on Symbicort since her COVID-19 infection. 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 She has recently completed iron infusions and expresses a feeling of needing them again.  The patient also reports having reflux, for which she takes omeprazole and famotidine. She has been experiencing snoring, restless sleep, and daytime tiredness, which she attributes to her two-job work schedule. A previous sleep study was inconclusive. 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       No Known Allergies  Immunization History  Administered  Date(s) Administered   Influenza,inj,Quad PF,6+ Mos 07/23/2022   Influenza,trivalent, recombinat, inj, PF 07/16/2023   PFIZER(Purple Top)SARS-COV-2 Vaccination 01/16/2020, 02/14/2020, 12/13/2020   Tdap 06/24/2013    Past Medical History:  Diagnosis Date   GERD  (gastroesophageal reflux disease)    Medical history non-contributory    Preterm labor    18 week SAB    Tobacco History: Social History   Tobacco Use  Smoking Status Never  Smokeless Tobacco Never   Counseling given: Not Answered   Outpatient Medications Prior to Visit  Medication Sig Dispense Refill   famotidine (PEPCID) 40 MG tablet Take 40 mg by mouth daily.     ferrous sulfate 325 (65 FE) MG tablet Take one tablet a day for five days. If you are tolerating it, increase to twice a day. If after another five days you are still tolerating it, increase to three times a day. At any point if you develop intolerable side effects, go back to the lower dose. 90 tablet 0   omeprazole (PRILOSEC) 20 MG capsule Take 20 mg by mouth daily.     albuterol (VENTOLIN HFA) 108 (90 Base) MCG/ACT inhaler USE 1 TO 2 INHALATIONS BY MOUTH  EVERY 6 HOURS AS NEEDED FOR  WHEEZING OR SHORTNESS OF BREATH 26.8 g 0   montelukast (SINGULAIR) 10 MG tablet Take 1 tablet (10 mg total) by mouth at bedtime. 30 tablet 11   potassium chloride SA (KLOR-CON M) 20 MEQ tablet Take 1 tablet (20 mEq total) by mouth 2 (two) times daily. 10 tablet 0   predniSONE (DELTASONE) 10 MG tablet Take 40 mg by mouth daily.     SYMBICORT 80-4.5 MCG/ACT inhaler Inhale 2 puffs into the lungs in the morning and at bedtime.     No facility-administered medications prior to visit.     Review of Systems:   Constitutional:   No  weight loss, night sweats,  Fevers, chills, fatigue, or  lassitude.  HEENT:   No headaches,  Difficulty swallowing,  Tooth/dental problems, or  Sore throat,                No sneezing, itching, ear ache, +asal congestion, post nasal drip,   CV:  No chest pain,  Orthopnea, PND, swelling in lower extremities, anasarca, dizziness, palpitations, syncope.   GI  No  abdominal pain, nausea, vomiting, diarrhea, change in bowel habits, loss of appetite, bloody stools.   Resp: .  No chest wall deformity  Skin: no rash  or lesions.  GU: no dysuria, change in color of urine, no urgency or frequency.  No flank pain, no hematuria   MS:  No joint pain or swelling.  No decreased range of motion.  No back pain.    Physical Exam  BP 132/86 (BP Location: Left Arm, Patient Position: Sitting, Cuff Size: Normal)   Pulse 94   Temp (!) 97.5 F (36.4 C)   Ht 5' 4.5" (1.638 m)   Wt 276 lb (125.2 kg)   SpO2 100%   BMI 46.64 kg/m   GEN: A/Ox3; pleasant , NAD, well nourished    HEENT:  [place]/AT,  NOSE-clear, THROAT-clear, no lesions, no postnasal drip or exudate noted.  Class 4 MP airway   NECK:  Supple w/ fair ROM; no JVD; normal carotid impulses w/o bruits; no thyromegaly or nodules palpated; no lymphadenopathy.    RESP  Clear  P & A; w/o, wheezes/ rales/ or rhonchi. no accessory muscle use, no dullness to percussion  CARD:  RRR,  no m/r/g, no peripheral edema, pulses intact, no cyanosis or clubbing.  GI:   Soft & nt; nml bowel sounds; no organomegaly or masses detected.   Musco: Warm bil, no deformities or joint swelling noted.   Neuro: alert, no focal deficits noted.    Skin: Warm, no lesions or rashes    Lab Results:  CBC   BMET   BNP No results found for: "BNP"  ProBNP No results found for: "PROBNP"  Imaging: No results found.  albuterol (PROVENTIL) (2.5 MG/3ML) 0.083% nebulizer solution 2.5 mg     Date Action Dose Route User   09/21/2023 1437 Given 2.5 mg Nebulization Rondel [place], CMA          Latest Ref Rng & Units 10/08/2021    9:13 AM  PFT Results  FVC-Pre L 2.51   FVC-Predicted Pre % 79   FVC-Post L 2.39   FVC-Predicted Post % 75   Pre FEV1/FVC % % 89   Post FEV1/FCV % % 84   FEV1-Pre L 2.22   FEV1-Predicted Pre % 85   FEV1-Post L 2.00   DLCO uncorrected ml/min/mmHg 15.03   DLCO UNC% % 67   DLCO corrected ml/min/mmHg 15.03   DLCO COR %Predicted % 67   DLVA Predicted % 107   TLC L 3.68   TLC % Predicted % 71   RV % Predicted % 68     No results  found for: "NITRICOXIDE"      Assessment & Plan:  Assessment and Plan    Asthma Exacerbation of asthma is evident with increased coughing, wheezing, and dyspnea, particularly after discontinuing Symbicort  leaving her without asthma medication since July. Symptoms are exacerbated by weather changes and physical exertion. We will start Breo inhaler, one puff daily, Asthma action plan given.  Albuterol nebulizer treatment given in the office today with perceived benefit.  Exam is unrevealing. Hold off on steroids at this time.  Restart Singulair daily.  Continue on Claritin.  Continue on reflux regimen. Check chest x-ray today.  Follow-up in 3 months  Suspected Sleep Apnea She reports snoring, restless sleep, and daytime fatigue,  Home sleep study ordered  - discussed how weight can impact sleep and risk for sleep disordered breathing - discussed options to assist with weight loss: combination of diet modification, cardiovascular and strength training exercises   - had an extensive discussion regarding the adverse health consequences related to untreated sleep disordered breathing - specifically discussed the risks for hypertension, coronary artery disease, cardiac dysrhythmias, cerebrovascular disease, and diabetes - lifestyle modification discussed   - discussed how sleep disruption can increase risk of accidents, particularly when driving - safe driving practices were discussed     Gastroesophageal Reflux Disease (GERD) Continue on PPI and H2 blocker   Follow-up Follow up in 3 months and As needed   Please contact office for sooner follow up if symptoms do not improve or worsen or seek emergency care     Rubye Oaks, NP 09/21/2023

## 2023-10-01 NOTE — Progress Notes (Signed)
Called and spoke with patient, advised of results/recommendations per Rubye Oaks NP.  She verbalized understanding.  She said she is having a lot of coughing.  She will call if anything changes before January.  She will call in January to schedule a 3 month follow up.  Nothing further needed.

## 2023-12-30 ENCOUNTER — Telehealth: Payer: Self-pay

## 2023-12-30 ENCOUNTER — Telehealth: Payer: Self-pay | Admitting: Adult Health

## 2023-12-30 NOTE — Telephone Encounter (Signed)
 Patient is returning missed call. She has not received her sleep study yet from [employer].

## 2023-12-30 NOTE — Telephone Encounter (Signed)
 Called pt to see if they had gotten their HST done pt did not answer LVM and Mychart msg

## 2023-12-31 ENCOUNTER — Other Ambulatory Visit: Payer: Self-pay | Admitting: *Deleted

## 2023-12-31 ENCOUNTER — Telehealth: Payer: No Typology Code available for payment source | Admitting: Adult Health

## 2023-12-31 MED ORDER — FLUTICASONE FUROATE-VILANTEROL 100-25 MCG/ACT IN AEPB: 1.0000 | INHALATION_SPRAY | Freq: Every day | RESPIRATORY_TRACT | 2 refills | Status: DC

## 2023-12-31 MED ORDER — ALBUTEROL SULFATE HFA 108 (90 BASE) MCG/ACT IN AERS: 1.0000 | INHALATION_SPRAY | RESPIRATORY_TRACT | 3 refills | Status: AC | PRN

## 2023-12-31 NOTE — Telephone Encounter (Signed)
 Called and spoke with patient, she states she has received calls from [facility], however she works the same hours that we do and when she calls them back she just gets put on hold.  I advised her that I would get a message to the [employer] people and let them know what is going on and that she has already registered online as she was told.  I let her know that I would cancel her appointment today since her HST has not been completed and that was the reason for her appointment today.  She said she also needed her inhalers refilled.  I spoke with [name] and she said we would refill her inhalers.  She was advised to call the office and schedule a 1-2 week out OV or virtual visit once she has her HST.  She verbalized understanding.  Nothing further needed.  E-mail sent to Protivin with [employer] letting her know the situation to see if she could assist with getting her the HST kit.

## 2023-12-31 NOTE — Telephone Encounter (Signed)
 Received e-mail from Stuart with [employer] that the following was done:  09/22/2023 - received order 09/22/2023 - texted patient [phone] 09/23/2023 - called and LM; texted patient 10/12/2023 - texted patient 10/14/2023 - I myself from a (336) area code texted and called and LM 10/20/2023 - texted patient 10/20/2023 - patient started online registration 10/21/2023 - called and LM; texted patient 12/31/2023 - called and LM  Victorino Dike will follow up with the patient to get the HST done.

## 2024-01-06 ENCOUNTER — Encounter: Payer: Self-pay | Admitting: Oncology

## 2024-01-06 ENCOUNTER — Telehealth: Payer: Self-pay | Admitting: Adult Health

## 2024-01-06 NOTE — Telephone Encounter (Signed)
 Tara Farrell- This PT called today and E2C2 passed it to me because it's complex and pt is upset.   She took home a sleep study from [place] but because she had nail polish on it was inconclusive . A Sleep Study was ordered thru [facility] after that and  as she is trying to register [facility] told her she would need to pay  255.00 because she needed to use it for 3 days. Insurance is  [employer]). I tried to tell her I would have Synetta Fail contact her. She said Synetta Fail already called her today and "presumably" told her she would have to call [employer] to work this all out with them.  I told the PT a manager would reach out to her via [facility] tomorrow. She can not talk at work on the phone.

## 2024-01-06 NOTE — Telephone Encounter (Signed)
 I called and left a message for the patient to call [employer] we sent her order to them back 09/21/2023 call [employer] [phone]

## 2024-01-07 NOTE — Telephone Encounter (Signed)
 Spoke to pt.  Pt does not want to complete HST w/ [facility].  Pt has requested to pick up one of our machines in this office & is scheduled for 4/4 at 11:45 AM.

## 2024-01-14 ENCOUNTER — Encounter

## 2024-01-14 DIAGNOSIS — R0683 Snoring: Secondary | ICD-10-CM

## 2024-01-20 DIAGNOSIS — G4733 Obstructive sleep apnea (adult) (pediatric): Secondary | ICD-10-CM | POA: Diagnosis not present

## 2024-01-26 NOTE — Telephone Encounter (Signed)
 Patient opted to do HST with kit from our office.  Test complete on 01/15/24, results in Epic.  Video visit scheduled for 03/10/24.  Nothing further needed.

## 2024-03-10 ENCOUNTER — Encounter: Payer: Self-pay | Admitting: Adult Health

## 2024-03-10 ENCOUNTER — Telehealth: Admitting: Adult Health

## 2024-03-10 DIAGNOSIS — J4530 Mild persistent asthma, uncomplicated: Secondary | ICD-10-CM | POA: Diagnosis not present

## 2024-03-10 MED ORDER — MONTELUKAST SODIUM 10 MG PO TABS: 10.0000 mg | ORAL_TABLET | Freq: Every day | ORAL | 11 refills | Status: AC

## 2024-03-10 MED ORDER — FLUTICASONE FUROATE-VILANTEROL 100-25 MCG/ACT IN AEPB: 1.0000 | INHALATION_SPRAY | Freq: Every day | RESPIRATORY_TRACT | 11 refills | Status: AC

## 2024-03-10 NOTE — Progress Notes (Signed)
 Virtual Visit via Video Note  I connected with Tara Farrell on 03/10/24 at  1:30 PM EDT by a video enabled telemedicine application and verified that I am speaking with the correct person using two identifiers.  Location: Patient: Home  Provider: Office    I discussed the limitations of evaluation and management by telemedicine and the availability of in person appointments. The patient expressed understanding and agreed to proceed.  History of Present Illness: 43 year old female never smoker followed for asthma Medical history significant for microcytic anemia secondary to dysfunctional uterine bleeding followed by hematology Today's video visit is a 57-month follow-up.  Last visit patient was having increased asthma symptoms with cough, wheezing and tightness.  She had ran out of her Singulair .  And insurance would not cover Symbicort.  At last visit she was started on Breo and restarted on Singulair .  Since last visit patient says she is doing much better.  Breathing is back to baseline.  She denies any flare of cough or wheezing.  Says she is doing very well.  She endorses compliance and needs refills of her Breo and Singulair .  She also was having symptoms suspicious for sleep apnea with snoring and daytime sleepiness.  She was set up for a home sleep study done on January 15, 2024 that showed severe sleep apnea with an AHI of 64.6/hour and SpO2 at 64%.  We discussed her sleep study results in detail.  We went over treatment options including weight loss and CPAP therapy.  Patient is in agreement to begin CPAP.  Patient education was given.    Observations/Objective: PFTs December 12, 2021 showed FEV1 85%, ratio 89, FVC 79%, DLCO 67%.   CT chest July 14, 2022 showed no suspicious pulmonary nodules, mildly enlarged lymph nodes above and below the diaphragm suspicious for lymphoproliferative disorder    Assessment and Plan: Mild persistent asthma with good control on current maintenance  regimen with Breo, Singulair  and Claritin .  Asthma action plan discussed.  Refill sent to the pharmacy  Chronic allergic rhinitis continue on Claritin  and Singulair  daily.  Severe obstructive sleep apnea.  Home sleep study shows AHI at 64.6/hour and SpO2 low at 64%.  Will start on CPAP therapy auto CPAP 5 to 15 cm H2O.  On return visit once patient is doing well and compliant would recommend an overnight oximetry test to rule out ongoing nocturnal desaturations.  May need an in-lab titration study if not able to control events.  Plan  Patient Instructions  Continue on BREO 1 puff daily, rinse after use Albuterol  inhaler As needed   Continue on Singulair  daily  Continue on Claritin  daily .  Begin CPAP At bedtime, wear all night long for at least 6hr or more  Work on healthy weight loss  Do not drive if sleepy  Follow up in 3 months -in person or virtual  Please contact office for sooner follow up if symptoms do not improve or worsen or seek emergency care      Follow Up Instructions:    I discussed the assessment and treatment plan with the patient. The patient was provided an opportunity to ask questions and all were answered. The patient agreed with the plan and demonstrated an understanding of the instructions.   The patient was advised to call back or seek an in-person evaluation if the symptoms worsen or if the condition fails to improve as anticipated.  I provided 30 minutes of non-face-to-face time during this encounter.   Tara Clark, NP

## 2024-03-10 NOTE — Patient Instructions (Addendum)
 Continue on BREO 1 puff daily, rinse after use Albuterol  inhaler As needed   Continue on Singulair  daily  Continue on Claritin  daily .  Begin CPAP At bedtime, wear all night long for at least 6hr or more  Work on healthy weight loss  Do not drive if sleepy  Follow up in 3 months -in person or virtual  Please contact office for sooner follow up if symptoms do not improve or worsen or seek emergency care

## 2024-03-13 DIAGNOSIS — R0683 Snoring: Secondary | ICD-10-CM

## 2024-03-13 NOTE — Telephone Encounter (Signed)
 Yes please refer to healthy weight and wellness

## 2024-03-13 NOTE — Telephone Encounter (Signed)
 Tara Farrell, Patient states that weight loss med is not covered and is requesting a referral to weight loss.  Please advise.  Thank you.

## 2024-03-14 NOTE — Telephone Encounter (Signed)
 Orders placed.

## 2024-04-19 DIAGNOSIS — G4733 Obstructive sleep apnea (adult) (pediatric): Secondary | ICD-10-CM

## 2024-04-19 NOTE — Telephone Encounter (Signed)
 Please advise I do not see an order for a CPAP at video visit it said to begin cpap,I just need to know the settings and I can put that in

## 2024-04-20 NOTE — Telephone Encounter (Signed)
 Sorry for the delay. Order did not go through, will place again. Please let the patient know that should reach out to her in 1-2 weeks if no repsonse message us  back.

## 2024-05-03 ENCOUNTER — Telehealth: Payer: Self-pay

## 2024-05-03 NOTE — Telephone Encounter (Signed)
 Sent an urgent message to [facility] on this. Will inform when I hear.    See other telephone encounter

## 2024-05-03 NOTE — Telephone Encounter (Signed)
 Per Arvella at [facility] The order is in and is in review. As soon as it comes back they will give her a call. 24-48 hours   He is asking the team to make it STAT

## 2024-05-03 NOTE — Telephone Encounter (Signed)
 Copied from CRM [id number]. Topic: Clinical - Order For Equipment >> May 03, 2024  9:43 AM Isabell A wrote: Reason for CRM: Patient cancelled her upcoming appointment due to not receiving her CPAP machine - patient states she has called several times, as well as sent MyChart messages and she has no updates and doesn't understand the hold up to getting her CPAP machine.   Callback number: [phone] pt states its ok to leave vm because she is at work.   It looks like this was ordered on 04-20-2024 and stated as received by Avelina on 04-21-24. PCC's, can you look into this?

## 2024-05-03 NOTE — Telephone Encounter (Signed)
 Sent an urgent message to [facility] on this. Will inform when I hear.

## 2024-05-03 NOTE — Telephone Encounter (Signed)
 Can we message the DME to see why her cpap is taking so long it was ordered as urgent

## 2024-05-03 NOTE — Telephone Encounter (Signed)
 Copied from CRM [id number]. Topic: Clinical - Order For Equipment >> May 01, 2024 11:51 AM Joesph PARAS wrote: Reason for CRM: Patient is calling to state that she still has not heard anything regarding the order related to her virtual visit. Patient is requesting information on who she can call to push this process along, since she has not heard anything since May.  Patient cannot see provider to follow up, as nothing has been done to follow up on.  Please update patient. If patient does not answer, please leave a DETAILED message at mobile number on file.

## 2024-05-08 ENCOUNTER — Telehealth: Payer: Self-pay

## 2024-05-08 NOTE — Telephone Encounter (Signed)
 Copied from CRM #[id number]. Topic: Clinical - Order For Equipment >> May 04, 2024  2:55 PM Joesph PARAS wrote: Reason for CRM: Informed by DME that insurance will not cover the CPAP. Please re-send order to location that will cover it.  Patient will not use CPAP if she has to pay for it.    Spoke with patient [facility] is to email her hardship paperwork to fill out and retuen to them .   NFN-

## 2024-05-26 ENCOUNTER — Ambulatory Visit: Admitting: Adult Health

## 2024-05-31 ENCOUNTER — Encounter (INDEPENDENT_AMBULATORY_CARE_PROVIDER_SITE_OTHER): Payer: Self-pay | Admitting: Family Medicine

## 2024-05-31 ENCOUNTER — Ambulatory Visit (INDEPENDENT_AMBULATORY_CARE_PROVIDER_SITE_OTHER): Admitting: Family Medicine

## 2024-05-31 VITALS — BP 162/110 | HR 92 | Temp 98.3°F | Ht 63.5 in | Wt 272.0 lb

## 2024-05-31 DIAGNOSIS — R030 Elevated blood-pressure reading, without diagnosis of hypertension: Secondary | ICD-10-CM | POA: Diagnosis not present

## 2024-05-31 DIAGNOSIS — G4733 Obstructive sleep apnea (adult) (pediatric): Secondary | ICD-10-CM | POA: Diagnosis not present

## 2024-05-31 DIAGNOSIS — E669 Obesity, unspecified: Secondary | ICD-10-CM

## 2024-05-31 DIAGNOSIS — Z6841 Body Mass Index (BMI) 40.0 and over, adult: Secondary | ICD-10-CM | POA: Diagnosis not present

## 2024-05-31 DIAGNOSIS — I10 Essential (primary) hypertension: Secondary | ICD-10-CM

## 2024-05-31 DIAGNOSIS — Z0289 Encounter for other administrative examinations: Secondary | ICD-10-CM

## 2024-05-31 NOTE — Progress Notes (Signed)
 Office: [phone]  /  Fax: [phone]  WEIGHT SUMMARY AND BIOMETRICS  Anthropometric Measurements Height: 5' 3.5 (1.613 m) Weight: 272 lb (123.4 kg) BMI (Calculated): 47.42 Peak Weight: 290 lb   Body Composition  Body Fat %: 53 % Fat Mass (lbs): 144.4 lbs Muscle Mass (lbs): 121.6 lbs Visceral Fat Rating : 17   Other Clinical Data Fasting: yes Labs: no Today's Visit #: Info Session Comments: Info Session    Chief Complaint: OBESITY    History of Present Illness Tara Farrell is a 43 year old female with obesity who presents for a consultation regarding weight management. She was referred by her pulmonologist at [facility] for evaluation of her obesity.  She has struggled with her weight for most of her life, with significant worsening following a pregnancy where she was hospitalized from August to December due to preterm labor concerns. During this time, she was on bed rest, which contributed to her weight gain.  She attempted the Optavia diet before her wedding in 2022, which was initially successful in helping her lose weight. However, she found the diet expensive and, after discontinuing it post-wedding, she regained the lost weight plus more.  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 She also has a recent diagnosis of sleep apnea, for which a CPAP machine was recommended, but she has not yet started treatment due to insurance and cost issues.  Her eating habits include not consuming three meals a day, with a preference for snacks and fast food due to convenience. She identifies as a 'grazer' and acknowledges eating due to stress, boredom, and while watching TV, particularly in the afternoons and evenings. No excessive hunger is reported, but she struggles with snack consumption and fast food, which she finds easier to access.      PHYSICAL EXAM:  Blood pressure (!) 162/110, pulse 92, temperature 98.3 F  (36.8 C), height 5' 3.5 (1.613 m), weight 272 lb (123.4 kg), SpO2 97%, unknown if currently breastfeeding. Body mass index is 47.43 kg/m.  DIAGNOSTIC DATA REVIEWED:  BMET    Component Value Date/Time   NA 134 (L) 07/10/2022 2053   K 3.2 (L) 07/10/2022 2053   CL 104 07/10/2022 2053   CO2 23 07/10/2022 2053   GLUCOSE 93 07/10/2022 2053   BUN 14 07/10/2022 2053   CREATININE 0.44 07/10/2022 2053   CALCIUM 9.1 07/10/2022 2053   GFRNONAA >60 07/10/2022 2053   GFRAA >90 06/16/2013 0545   No results found for: HGBA1C No results found for: INSULIN Lab Results  Component Value Date   TSH 0.633 07/13/2022   CBC    Component Value Date/Time   WBC 2.9 (L) 10/30/2022 0849   WBC 5.3 07/10/2022 2053   RBC 5.63 (H) 10/30/2022 0849   HGB 11.4 (L) 10/30/2022 0849   HCT 39.6 10/30/2022 0849   PLT 289 10/30/2022 0849   MCV 70.3 (L) 10/30/2022 0849   MCH 20.2 (L) 10/30/2022 0849   MCHC 28.8 (L) 10/30/2022 0849   RDW 17.1 (H) 10/30/2022 0849   Iron Studies    Component Value Date/Time   IRON 23 (L) 07/10/2022 2053   TIBC 565 (H) 07/10/2022 2053   FERRITIN 7 (L) 10/30/2022 0849   IRONPCTSAT 4 (L) 07/10/2022 2053   Lipid Panel  No results found for: CHOL, TRIG, HDL, CHOLHDL, VLDL, LDLCALC, LDLDIRECT Hepatic Function Panel     Component Value Date/Time   PROT 8.9 (H) 07/10/2022 2053  ALBUMIN 3.6 07/10/2022 2053   AST 35 07/10/2022 2053   ALT 31 07/10/2022 2053   ALKPHOS 89 07/10/2022 2053   BILITOT 0.4 07/10/2022 2053      Component Value Date/Time   TSH 0.633 07/13/2022 1520   Nutritional No results found for: VD25OH   Assessment and Plan Assessment & Plan Obesity Chronic obesity exacerbated by pregnancy-related immobility and lifestyle factors. Recent weight gain post-Optavia diet, with challenges including snack preference, fast food consumption, and emotional eating. Obesity is a disease with genetic and physiological factors contributing to  weight retention and gain. Comorbid conditions include sleep apnea and long COVID, potentially exacerbated by obesity. Treatment involves a comprehensive workup to identify physiological mechanisms contributing to weight gain and a personalized plan to address these. Emphasis on understanding individual metabolic responses and tailoring dietary plans accordingly. Insurance considerations include billing as primary care to reduce costs and avoiding same-day primary care visits to ensure coverage. - Provide new patient paperwork packet with detailed questions on health and eating habits. - Schedule a comprehensive workup visit, including testing and physician consultation. - Initiate a targeted eating plan based on initial assessment and test results. - Schedule follow-up visits every two weeks for the first three months, then monthly during active weight loss phase. - Discuss potential medication options if needed, considering insurance coverage and side effects. - Educate on the importance of real food and personalized dietary plans. - Avoid scheduling primary care visits on the same day as obesity management visits to ensure insurance coverage.  OSA, not yet treated She is working on getting her CPAP machine - Will work on diet, exercise and weight loss as discussed today as an important part of the treatment plan - Will get sleep study and see if her insurance will cover Zepbound  Elevated BP Pt BP elevated, may be related to new visit or OSA Will recheck when she is seen for her new patient work-up visit - Will work on diet, exercise and weight loss as discussed today as an important part of the treatment plan if BP remains elevated.      I have personally spent 41 minutes total time today in preparation, patient care, and documentation for this visit, including the following: review of clinical lab tests; review of medical history, review of the pathophysiology of obesity and various  treatment options   She was informed of the importance of frequent follow up visits to maximize her success with intensive lifestyle modifications for her multiple health conditions.    Louann Penton, MD

## 2024-07-03 ENCOUNTER — Encounter (INDEPENDENT_AMBULATORY_CARE_PROVIDER_SITE_OTHER): Payer: Self-pay

## 2024-07-06 ENCOUNTER — Encounter (INDEPENDENT_AMBULATORY_CARE_PROVIDER_SITE_OTHER): Payer: Self-pay | Admitting: Nurse Practitioner

## 2024-07-06 ENCOUNTER — Ambulatory Visit (INDEPENDENT_AMBULATORY_CARE_PROVIDER_SITE_OTHER): Admitting: Nurse Practitioner

## 2024-07-06 VITALS — BP 138/83 | HR 107 | Temp 97.9°F | Ht 63.5 in | Wt 276.0 lb

## 2024-07-06 DIAGNOSIS — R5383 Other fatigue: Secondary | ICD-10-CM

## 2024-07-06 DIAGNOSIS — D500 Iron deficiency anemia secondary to blood loss (chronic): Secondary | ICD-10-CM

## 2024-07-06 DIAGNOSIS — G4733 Obstructive sleep apnea (adult) (pediatric): Secondary | ICD-10-CM

## 2024-07-06 DIAGNOSIS — Z1331 Encounter for screening for depression: Secondary | ICD-10-CM

## 2024-07-06 DIAGNOSIS — K219 Gastro-esophageal reflux disease without esophagitis: Secondary | ICD-10-CM | POA: Diagnosis not present

## 2024-07-06 DIAGNOSIS — Z6841 Body Mass Index (BMI) 40.0 and over, adult: Secondary | ICD-10-CM

## 2024-07-06 DIAGNOSIS — J4530 Mild persistent asthma, uncomplicated: Secondary | ICD-10-CM

## 2024-07-06 DIAGNOSIS — E66813 Obesity, class 3: Secondary | ICD-10-CM

## 2024-07-06 DIAGNOSIS — R0602 Shortness of breath: Secondary | ICD-10-CM

## 2024-07-06 DIAGNOSIS — E559 Vitamin D deficiency, unspecified: Secondary | ICD-10-CM

## 2024-07-06 NOTE — Progress Notes (Signed)
 [address] [place],  [place], [place] [address]  Office: [phone]  /  Fax: [phone]   Subjective   Initial Visit  Tara Farrell (MR# [id number]) is a 43 y.o. female who presents for evaluation and treatment of obesity and related comorbidities. Current BMI is Body mass index is 48.12 kg/m. Tara Farrell has been struggling with her weight for many years and has been unsuccessful in either losing weight, maintaining weight loss, or reaching her healthy weight goal.  Tara Farrell is currently in the action stage of change and ready to dedicate time achieving and maintaining a healthier weight. Tara Farrell is interested in becoming our patient and working on intensive lifestyle modifications including (but not limited to) diet and exercise for weight loss.  She has struggled with her weight for most of her life, with significant worsening following a pregnancy where she was hospitalized from August to December due to preterm labor concerns. During this time, she was on bed rest, which contributed to her weight gain.   She attempted the Optavia diet before her wedding in 2022, which was initially successful in helping her lose weight. However, she found the diet expensive and, after discontinuing it post-wedding, she regained the lost weight plus more.   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 She also has a recent diagnosis of severe sleep apnea with AHI 64.6 and sleep nadir 64% for which a CPAP machine was recommended, but she has not yet started treatment due to insurance and cost issues.   Her eating habits include not consuming three meals a day, with a preference for snacks and fast food due to convenience. She identifies as a 'grazer' and acknowledges eating due to stress, boredom, and while watching TV, particularly in the afternoons and evenings. No excessive hunger is reported, but she struggles with snack consumption and fast food, which she finds easier  to access.  Weight history:  When asked how their weight has affected their life and health, she states: Contributed to medical problems, Having Tara Farrell, and Having poor endurance  When asked what else they would like to accomplish? She states: Adopt a healthier eating pattern and lifestyle, Improve energy levels and physical activity, Improve existing medical conditions, Reduce number of medications, and Improve quality of life  She starting to note weight gain during : childhood.  Life events associated with weight gain include : pregnancy.   Other contributing factors: family history of obesity, disruption of circadian rhythm / sleep disordered breathing, consumption of processed foods, moderate to high levels of stress, reduced physical activity, chronic skipping of meals, need for convenience due to lack of time, multiple weight loss attempts in the past, sedentary job, hectic pace of life, need for convenient foods, and self - critic or all-or-none mindset.  Their highest weight has been:  276 lbs.  Desired weight: 150  Previous weight-loss programs : Optavia.  Their maximum weight loss was:  40 lbs.  Their greatest challenge with dieting: difficulty maintaining reduced calorie state and meal preparation and cooking.  Current or previous pharmacotherapy: None.  Response to medication: Never tried medications   Nutritional History:  Current nutrition plan: None.  How many times do you eat outside the home: 2-4 per week  How often do they skip meals: skips breakfast  What beverages do they drink: regular soda , juice, protein shakes , smoothies, and alcohol.(Vodka/wine couple times a month)   Use of artificial sweetners : No  Food intolerances or  dislikes: Spicy food.  Food triggers: Stress, Boredom, and Seeking reward.  Food cravings: Starches / Carbohydrates  Do they struggle with excessive hunger or portion control : Yes    Physical Activity:  Current level  of physical activity: None  Barriers to Exercise: time, energy, and does not enjoy   Past medical history includes:   Past Medical History:  Diagnosis Date   Acid reflux    Anemia    Asthma    COVID-19 long hauler    GERD (gastroesophageal reflux disease)    Heartburn    Infertility, female    Medical history non-contributory    OSA (obstructive sleep apnea)    Preterm labor    18 week SAB   SOB (shortness of breath)      Objective   BP 138/83   Pulse (!) 107   Temp 97.9 F (36.6 C)   Ht 5' 3.5 (1.613 m)   Wt 276 lb (125.2 kg)   SpO2 100%   BMI 48.12 kg/m  She was weighed on the bioimpedance scale: Body mass index is 48.12 kg/m.    Anthropometrics:  Vitals Temp: 97.9 F (36.6 C) BP: 138/83 Pulse Rate: (!) 107 SpO2: 100 %   Anthropometric Measurements Height: 5' 3.5 (1.613 m) Weight: 276 lb (125.2 kg) BMI (Calculated): 48.12 Weight at Last Visit: 0lb Weight Lost Since Last Visit: 0lb Weight Gained Since Last Visit: 0lb Starting Weight: 276lb Total Weight Loss (lbs): 0 lb (0 kg) Peak Weight: 290lb Waist Measurement : 50.5 inches   Body Composition  Body Fat %: 51.5 % Fat Mass (lbs): 142.2 lbs Muscle Mass (lbs): 127 lbs Total Body Water (lbs): 100.4 lbs Visceral Fat Rating : 17   Other Clinical Data RMR: [id number] Fasting: Yes Labs: Yes Today's Visit #: 1 Starting Date: 07/06/24 Comments: first visit    Physical Exam:  General: She is overweight, cooperative, alert, well developed, and in no acute distress. PSYCH: Has normal mood, affect and thought process.   HEENT: EOMI, sclerae are anicteric. Lungs: Normal breathing effort, no conversational dyspnea. Extremities: No edema.  Neurologic: No gross sensory or motor deficits. No tremors or fasciculations noted.    Diagnostic Data Reviewed  EKG: Normal sinus rhythm, rate 98. No conduction abnormalities, abnormal Q waves or chamber enlargement.  Indirect Calorimeter completed today  shows a VO2 of 319 and a REE of 2203.  Her calculated basal metabolic rate is 8058 thus her resting energy expenditure faster than calculated.  Depression Screen  Tara Farrell's PHQ-9 score was: 7.     07/06/2024    8:31 AM  Depression screen PHQ 2/9  Decreased Interest 0  Down, Depressed, Hopeless 0  PHQ - 2 Score 0  Altered sleeping 2  Tired, decreased energy 2  Change in appetite 1  Feeling bad or failure about yourself  0  Trouble concentrating 1  Moving slowly or fidgety/restless 1  Suicidal thoughts 0  PHQ-9 Score 7  Difficult doing work/chores Somewhat difficult    Screening for Sleep Related Breathing Disorders  Tara Farrell admits to daytime somnolence and admits to waking up still tired. Patient has a history of symptoms of daytime Tara Farrell, morning Tara Farrell, and severe OSA- CPAP, did not decide to get due to cost. Tara Farrell generally gets 5-7 hours of sleep per night, and states that she has generally restful sleep. Snoring is present. Apneic episodes are present. Epworth Sleepiness Score is 8.   BMET    Component Value Date/Time   NA 134 (L) 07/10/2022  2053   K 3.2 (L) 07/10/2022 2053   CL 104 07/10/2022 2053   CO2 23 07/10/2022 2053   GLUCOSE 93 07/10/2022 2053   BUN 14 07/10/2022 2053   CREATININE 0.44 07/10/2022 2053   CALCIUM 9.1 07/10/2022 2053   GFRNONAA >60 07/10/2022 2053   GFRAA >90 06/16/2013 0545   No results found for: HGBA1C No results found for: INSULIN  CBC    Component Value Date/Time   WBC 2.9 (L) 10/30/2022 0849   WBC 5.3 07/10/2022 2053   RBC 5.63 (H) 10/30/2022 0849   HGB 11.4 (L) 10/30/2022 0849   HCT 39.6 10/30/2022 0849   PLT 289 10/30/2022 0849   MCV 70.3 (L) 10/30/2022 0849   MCH 20.2 (L) 10/30/2022 0849   MCHC 28.8 (L) 10/30/2022 0849   RDW 17.1 (H) 10/30/2022 0849   Iron/TIBC/Ferritin/ %Sat    Component Value Date/Time   IRON 23 (L) 07/10/2022 2053   TIBC 565 (H) 07/10/2022 2053   FERRITIN 7 (L) 10/30/2022 0849   IRONPCTSAT 4 (L)  07/10/2022 2053   Lipid Panel  No results found for: CHOL, TRIG, HDL, CHOLHDL, VLDL, LDLCALC, LDLDIRECT Hepatic Function Panel     Component Value Date/Time   PROT 8.9 (H) 07/10/2022 2053   ALBUMIN 3.6 07/10/2022 2053   AST 35 07/10/2022 2053   ALT 31 07/10/2022 2053   ALKPHOS 89 07/10/2022 2053   BILITOT 0.4 07/10/2022 2053      Component Value Date/Time   TSH 0.633 07/13/2022 1520     Assessment and Plan   TREATMENT PLAN FOR OBESITY:  Recommended Dietary Goals  Tara Farrell is currently in the action stage of change. As such, her goal is to implement medically supervised obesity management plan.  She has agreed to implement: the Category 3 plan - 1500 kcal per day  Behavioral Intervention  We discussed the following Behavioral Modification Strategies today: increasing lean protein intake to established goals, decreasing simple carbohydrates , increasing vegetables, increasing lower glycemic fruits, increasing fiber rich foods, avoiding skipping meals, increasing water intake, work on meal planning and preparation, reading food labels , keeping healthy foods at home, identifying sources and decreasing liquid calories, decreasing eating out or consumption of processed foods, and making healthy choices when eating convenient foods, planning for success, and better snacking choices  Additional resources provided today: Handout on healthy eating and balanced plate, Handout on complex carbohydrates and lean sources of protein, Personalized instruction on the use of artificial intelligence for recipes, tailored meal plans, calorie tracking, and finding healthier options when eating out. , Category 3 packet, and Handout principles of weight management  Recommended Physical Activity Goals  Tara Farrell has been advised to work up to 150 minutes of moderate intensity aerobic activity a week and strengthening exercises 2-3 times per week for cardiovascular health, weight loss  maintenance and preservation of muscle mass.   She has agreed to :  Think about enjoyable ways to increase daily physical activity and overcoming barriers to exercise and Increase physical activity in their day and reduce sedentary time (increase NEAT).  Medical Interventions and Pharmacotherapy We will work on building a Therapist, art and behavioral strategies. We will discuss the role of pharmacotherapy as an adjunct at subsequent visits.   ASSOCIATED CONDITIONS ADDRESSED TODAY  Other Tara Farrell Tara Farrell does feel that her weight is causing her energy to be lower than it should be. Tara Farrell may be related to obesity, depression or many other causes. Labs will be ordered, and in the meanwhile,  Tara Farrell will focus on self care including making healthy food choices, increasing physical activity and focusing on stress reduction. - EKG 12 lead  Shortness of Breath Tara Farrell notes increasing shortness of breath with physical activity and seems to be worsening over time with weight gain. She notes getting out of breath sooner with activity than she used to. This has not gotten worse recently. Tara Farrell denies shortness of breath at rest or orthopnea.  Depression screening 7- currently doing well without medication  OSA (obstructive sleep apnea)     Currently declines CPAP due to cost      Loss of 10-15% body weight can help improve OSA   Iron deficiency anemia due to chronic blood loss Will send results to oncology/hematology Has needed iron transfusions in the past, not currently taking her iron supplements.  -     CBC with Differential/Platelet -     Iron, TIBC and Ferritin Panel  Mild persistent asthma without complication       Continue to follow with pulmonology       Continue Singulair  and Breo daily.  Use albuterol  q 4 hours as needed.   Gastroesophageal reflux disease without esophagitis Continue nutrition and behavior modifications  Loss of 10-15% body weight can help  improve symptoms Continue Omeprazole 20 mg QAM and famotidine  40 q hs -     Magnesium   Class 3 severe obesity with serious comorbidity and body mass index (BMI) of 45.0 to 49.9 in adult, unspecified obesity type See plan above -     EKG 12-Lead -     CBC with Differential/Platelet -     Comprehensive metabolic panel with GFR -     Hemoglobin A1c -     Insulin , random -     Lipid panel -     Magnesium  -     TSH -     Vitamin B12 -     VITAMIN D  25 Hydroxy (Vit-D Deficiency, Fractures)  Vitamin D  deficiency If Vit D level remains low will supplement with Ergocalciferol  50000 units once a week for 12 weeks and then recheck level.   -     VITAMIN D  25 Hydroxy (Vit-D Deficiency, Fractures)    Follow-up  She was informed of the importance of frequent follow-up visits to maximize her success with intensive lifestyle modifications for her multiple health conditions. She was informed we would discuss her lab results at her next visit unless there is a critical issue that needs to be addressed sooner. Tara Farrell agreed to keep her next visit at the agreed upon time to discuss these results.  Attestation Statement  This is the patient's intake visit at [facility]. The patient's Health Questionnaire was reviewed at length. Included in the packet: current and past health history, medications, allergies, ROS, gynecologic history (women only), surgical history, family history, social history, weight history, weight loss surgery history (for those that have had weight loss surgery), nutritional evaluation, mood and food questionnaire, PHQ9, Epworth questionnaire, sleep habits questionnaire, patient life and health improvement goals questionnaire. These will all be scanned into the patient's chart under media.   During the visit, I independently reviewed the patient's EKG, previous labs, bioimpedance scale results, and indirect calorimetry results. I used this information to medically  tailor a meal plan for the patient that will help her to lose weight and will improve her obesity-related conditions. I performed a medically necessary appropriate examination and/or evaluation. I discussed the assessment and treatment plan with the patient.  The patient was provided an opportunity to ask questions and all were answered. The patient agreed with the plan and demonstrated an understanding of the instructions. Labs were ordered at this visit and will be reviewed at the next visit unless critical results need to be addressed immediately. Clinical information was updated and documented in the EMR.   In addition, they received basic education on identification of processed foods and reduction of these, different sources of lean proteins and complex carbohydrates and how to eat balanced by incorporation of whole foods.  Reviewed by clinician on day of visit: allergies, medications, problem list, medical history, surgical history, family history, social history, and previous encounter notes.  I have spent 57 minutes in the care of the patient today including: 20 minutes before the visit reviewing and preparing the chart. 31 minutes face-to-face assessing and reviewing listed medical problems as outlined in obesity care plan, providing nutritional and behavioral counseling on topics outlined in the obesity care plan, independently interpreting test results and goals of care, as described in assessment and plan, reviewing and discussing biometric information and progress, reviewing latest PCP notes and specialist consultations, managing referral , and ordering diagnostics - see orders 6 minutes after the visit updating chart and documentation of encounter.       Cristiana Yochim ANP-C

## 2024-07-07 ENCOUNTER — Ambulatory Visit (INDEPENDENT_AMBULATORY_CARE_PROVIDER_SITE_OTHER): Payer: Self-pay | Admitting: Nurse Practitioner

## 2024-07-07 DIAGNOSIS — E559 Vitamin D deficiency, unspecified: Secondary | ICD-10-CM

## 2024-07-07 LAB — HEMOGLOBIN A1C
Est. average glucose Bld gHb Est-mCnc: 131 mg/dL
Hgb A1c MFr Bld: 6.2 % — ABNORMAL HIGH (ref 4.8–5.6)

## 2024-07-07 LAB — CBC WITH DIFFERENTIAL/PLATELET
Basophils Absolute: 0 x10E3/uL (ref 0.0–0.2)
Basos: 0 %
EOS (ABSOLUTE): 0.1 x10E3/uL (ref 0.0–0.4)
Eos: 1 %
Hematocrit: 36.7 % (ref 34.0–46.6)
Hemoglobin: 10.5 g/dL — ABNORMAL LOW (ref 11.1–15.9)
Immature Grans (Abs): 0 x10E3/uL (ref 0.0–0.1)
Immature Granulocytes: 0 %
Lymphocytes Absolute: 1.7 x10E3/uL (ref 0.7–3.1)
Lymphs: 33 %
MCH: 18.6 pg — ABNORMAL LOW (ref 26.6–33.0)
MCHC: 28.6 g/dL — ABNORMAL LOW (ref 31.5–35.7)
MCV: 65 fL — ABNORMAL LOW (ref 79–97)
Monocytes Absolute: 0.4 x10E3/uL (ref 0.1–0.9)
Monocytes: 7 %
Neutrophils Absolute: 3 x10E3/uL (ref 1.4–7.0)
Neutrophils: 59 %
Platelets: 433 x10E3/uL (ref 150–450)
RBC: 5.65 x10E6/uL — ABNORMAL HIGH (ref 3.77–5.28)
RDW: 18.6 % — ABNORMAL HIGH (ref 11.7–15.4)
WBC: 5.1 x10E3/uL (ref 3.4–10.8)

## 2024-07-07 LAB — COMPREHENSIVE METABOLIC PANEL WITH GFR
ALT: 16 IU/L (ref 0–32)
AST: 19 IU/L (ref 0–40)
Albumin: 4.5 g/dL (ref 3.9–4.9)
Alkaline Phosphatase: 113 IU/L (ref 41–116)
BUN/Creatinine Ratio: 13 (ref 9–23)
BUN: 7 mg/dL (ref 6–24)
Bilirubin Total: 0.4 mg/dL (ref 0.0–1.2)
CO2: 22 mmol/L (ref 20–29)
Calcium: 9.6 mg/dL (ref 8.7–10.2)
Chloride: 98 mmol/L (ref 96–106)
Creatinine, Ser: 0.56 mg/dL — ABNORMAL LOW (ref 0.57–1.00)
Globulin, Total: 3.2 g/dL (ref 1.5–4.5)
Glucose: 80 mg/dL (ref 70–99)
Potassium: 4.2 mmol/L (ref 3.5–5.2)
Sodium: 136 mmol/L (ref 134–144)
Total Protein: 7.7 g/dL (ref 6.0–8.5)
eGFR: 117 mL/min/1.73 (ref >59)

## 2024-07-07 LAB — IRON,TIBC AND FERRITIN PANEL
Ferritin: 10 ng/mL — ABNORMAL LOW (ref 15–150)
Iron Saturation: 7 % — CL (ref 15–55)
Iron: 33 ug/dL (ref 27–159)
Total Iron Binding Capacity: 465 ug/dL — ABNORMAL HIGH (ref 250–450)
UIBC: 432 ug/dL — ABNORMAL HIGH (ref 131–425)

## 2024-07-07 LAB — LIPID PANEL
Chol/HDL Ratio: 3.2 ratio (ref 0.0–4.4)
Cholesterol, Total: 186 mg/dL (ref 100–199)
HDL: 58 mg/dL (ref >39)
LDL Chol Calc (NIH): 110 mg/dL — ABNORMAL HIGH (ref 0–99)
Triglycerides: 99 mg/dL (ref 0–149)
VLDL Cholesterol Cal: 18 mg/dL (ref 5–40)

## 2024-07-07 LAB — TSH: TSH: 1.62 u[IU]/mL (ref 0.450–4.500)

## 2024-07-07 LAB — VITAMIN B12: Vitamin B-12: 1647 pg/mL — ABNORMAL HIGH (ref 232–1245)

## 2024-07-07 LAB — MAGNESIUM: Magnesium: 1.8 mg/dL (ref 1.6–2.3)

## 2024-07-07 LAB — VITAMIN D 25 HYDROXY (VIT D DEFICIENCY, FRACTURES): Vit D, 25-Hydroxy: 4.9 ng/mL — ABNORMAL LOW (ref 30.0–100.0)

## 2024-07-07 LAB — INSULIN, RANDOM: INSULIN: 30.1 u[IU]/mL — ABNORMAL HIGH (ref 2.6–24.9)

## 2024-07-10 MED ORDER — VITAMIN D (ERGOCALCIFEROL) 1.25 MG (50000 UNIT) PO CAPS: 50000.0000 [IU] | ORAL_CAPSULE | ORAL | 1 refills | Status: DC

## 2024-07-10 NOTE — Progress Notes (Signed)
 Thank you- I think she was seen in [place]- sorry

## 2024-07-11 ENCOUNTER — Other Ambulatory Visit (INDEPENDENT_AMBULATORY_CARE_PROVIDER_SITE_OTHER): Payer: Self-pay | Admitting: Nurse Practitioner

## 2024-07-11 DIAGNOSIS — D500 Iron deficiency anemia secondary to blood loss (chronic): Secondary | ICD-10-CM

## 2024-07-18 ENCOUNTER — Other Ambulatory Visit: Payer: Self-pay

## 2024-07-18 ENCOUNTER — Encounter: Payer: Self-pay | Admitting: Hematology and Oncology

## 2024-07-18 ENCOUNTER — Other Ambulatory Visit: Payer: Self-pay | Admitting: Hematology and Oncology

## 2024-07-18 ENCOUNTER — Inpatient Hospital Stay: Attending: Hematology and Oncology

## 2024-07-18 ENCOUNTER — Inpatient Hospital Stay: Admitting: Hematology and Oncology

## 2024-07-18 VITALS — BP 139/85 | HR 91 | Temp 99.2°F | Resp 18 | Ht 63.5 in | Wt 274.4 lb

## 2024-07-18 DIAGNOSIS — N920 Excessive and frequent menstruation with regular cycle: Secondary | ICD-10-CM

## 2024-07-18 DIAGNOSIS — D500 Iron deficiency anemia secondary to blood loss (chronic): Secondary | ICD-10-CM

## 2024-07-18 DIAGNOSIS — D509 Iron deficiency anemia, unspecified: Secondary | ICD-10-CM

## 2024-07-18 LAB — CMP (CANCER CENTER ONLY)
ALT: 19 U/L (ref 0–44)
AST: 25 U/L (ref 15–41)
Albumin: 4.5 g/dL (ref 3.5–5.0)
Alkaline Phosphatase: 102 U/L (ref 38–126)
Anion gap: 11 (ref 5–15)
BUN: 7 mg/dL (ref 6–20)
CO2: 25 mmol/L (ref 22–32)
Calcium: 9.6 mg/dL (ref 8.9–10.3)
Chloride: 100 mmol/L (ref 98–111)
Creatinine: 0.59 mg/dL (ref 0.44–1.00)
GFR, Estimated: >60 mL/min (ref >60)
Glucose, Bld: 90 mg/dL (ref 70–99)
Potassium: 3.9 mmol/L (ref 3.5–5.1)
Sodium: 136 mmol/L (ref 135–145)
Total Bilirubin: 0.4 mg/dL (ref 0.0–1.2)
Total Protein: 8.2 g/dL — ABNORMAL HIGH (ref 6.5–8.1)

## 2024-07-18 LAB — CBC WITH DIFFERENTIAL (CANCER CENTER ONLY)
Abs Immature Granulocytes: 0.01 K/uL (ref 0.00–0.07)
Basophils Absolute: 0 K/uL (ref 0.0–0.1)
Basophils Relative: 0 %
Eosinophils Absolute: 0.1 K/uL (ref 0.0–0.5)
Eosinophils Relative: 1 %
HCT: 33.6 % — ABNORMAL LOW (ref 36.0–46.0)
Hemoglobin: 9.9 g/dL — ABNORMAL LOW (ref 12.0–15.0)
Immature Granulocytes: 0 %
Lymphocytes Relative: 42 %
Lymphs Abs: 2 K/uL (ref 0.7–4.0)
MCH: 18.5 pg — ABNORMAL LOW (ref 26.0–34.0)
MCHC: 29.5 g/dL — ABNORMAL LOW (ref 30.0–36.0)
MCV: 62.7 fL — ABNORMAL LOW (ref 80.0–100.0)
Monocytes Absolute: 0.4 K/uL (ref 0.1–1.0)
Monocytes Relative: 9 %
Neutro Abs: 2.3 K/uL (ref 1.7–7.7)
Neutrophils Relative %: 48 %
Platelet Count: 349 K/uL (ref 150–400)
RBC: 5.36 MIL/uL — ABNORMAL HIGH (ref 3.87–5.11)
RDW: 19.3 % — ABNORMAL HIGH (ref 11.5–15.5)
WBC Count: 4.7 K/uL (ref 4.0–10.5)
nRBC: 0 % (ref 0.0–0.2)

## 2024-07-18 LAB — VITAMIN B12: Vitamin B-12: 2084 pg/mL — ABNORMAL HIGH (ref 180–914)

## 2024-07-18 LAB — TSH: TSH: 1.31 u[IU]/mL (ref 0.350–4.500)

## 2024-07-18 LAB — IRON AND TIBC
Iron: 32 ug/dL (ref 28–170)
Saturation Ratios: 6 % — ABNORMAL LOW (ref 10.4–31.8)
TIBC: 543 ug/dL — ABNORMAL HIGH (ref 250–450)
UIBC: 511 ug/dL

## 2024-07-18 LAB — FOLATE: Folate: 7.9 ng/mL (ref >5.9)

## 2024-07-18 LAB — FERRITIN: Ferritin: 11 ng/mL (ref 11–307)

## 2024-07-20 ENCOUNTER — Encounter: Payer: Self-pay | Admitting: Oncology

## 2024-07-20 ENCOUNTER — Ambulatory Visit (INDEPENDENT_AMBULATORY_CARE_PROVIDER_SITE_OTHER): Admitting: Nurse Practitioner

## 2024-07-20 ENCOUNTER — Encounter (INDEPENDENT_AMBULATORY_CARE_PROVIDER_SITE_OTHER): Payer: Self-pay | Admitting: Nurse Practitioner

## 2024-07-20 ENCOUNTER — Other Ambulatory Visit (HOSPITAL_BASED_OUTPATIENT_CLINIC_OR_DEPARTMENT_OTHER): Payer: Self-pay

## 2024-07-20 ENCOUNTER — Telehealth (INDEPENDENT_AMBULATORY_CARE_PROVIDER_SITE_OTHER): Payer: Self-pay | Admitting: *Deleted

## 2024-07-20 VITALS — BP 134/84 | HR 94 | Temp 98.4°F | Ht 63.5 in | Wt 269.0 lb

## 2024-07-20 DIAGNOSIS — R7303 Prediabetes: Secondary | ICD-10-CM

## 2024-07-20 DIAGNOSIS — E66813 Obesity, class 3: Secondary | ICD-10-CM

## 2024-07-20 DIAGNOSIS — G4733 Obstructive sleep apnea (adult) (pediatric): Secondary | ICD-10-CM | POA: Diagnosis not present

## 2024-07-20 DIAGNOSIS — E7800 Pure hypercholesterolemia, unspecified: Secondary | ICD-10-CM

## 2024-07-20 DIAGNOSIS — E88819 Insulin resistance, unspecified: Secondary | ICD-10-CM

## 2024-07-20 DIAGNOSIS — Z6841 Body Mass Index (BMI) 40.0 and over, adult: Secondary | ICD-10-CM

## 2024-07-20 DIAGNOSIS — E559 Vitamin D deficiency, unspecified: Secondary | ICD-10-CM | POA: Diagnosis not present

## 2024-07-20 DIAGNOSIS — D500 Iron deficiency anemia secondary to blood loss (chronic): Secondary | ICD-10-CM | POA: Diagnosis not present

## 2024-07-20 MED ORDER — ZEPBOUND 2.5 MG/0.5ML ~~LOC~~ SOAJ
2.5000 mg | SUBCUTANEOUS | 0 refills | Status: DC
  Filled 2024-07-20 – 2024-07-27 (×3): qty 2, 28d supply, fill #0

## 2024-07-20 NOTE — Patient Instructions (Signed)
 Living With Sleep Apnea Sleep apnea is a condition that affects your breathing while you're sleeping. Your tongue or the tissue in your throat may block the flow of air while you sleep. You may have shallow breathing or stop breathing for short periods of time. The breaks in breathing interrupt the deep sleep that you need to feel rested. Even if you don't wake up from the gaps in breathing, you may feel tired during the day. People with sleep apnea may snore loudly. You may have a headache in the morning and feel anxious or depressed. How can sleep apnea affect me? Sleep apnea increases your chances of being very tired during the day. This is called daytime fatigue. Sleep apnea can also increase your risk of: Heart attack. Stroke. Obesity. Type 2 diabetes. Heart failure. Irregular heartbeat. High blood pressure. If you are very tired during the day, you may be more likely to: Not do well in school or at work. Fall asleep while driving. Have trouble paying attention. Develop depression or anxiety. Have problems having sex. This is called sexual dysfunction. What actions can I take to manage sleep apnea? Sleep apnea treatment  If you were given a device to open your airway while you sleep, use it only as told by your health care provider. You may be given: An oral appliance. This is a mouthpiece that shifts your lower jaw forward. A continuous positive airway pressure (CPAP) device. This blows air through a mask. A nasal expiratory positive airway pressure (EPAP) device. This has valves that you put into each nostril. A bi-level positive airway pressure (BIPAP) device. This blows air through a mask when you breathe in and breathe out. You may need surgery if other treatments don't work for you. Sleep habits Go to sleep and wake up at the same time every day. This helps set your internal clock for sleeping. If you stay up later than usual on weekends, try to get up in the morning within 2  hours of the time you usually wake up. Try to get at least 7-9 hours of sleep each night. Stop using a computer, tablet, and mobile phone a few hours before bedtime. Do not take long naps during the day. If you nap, limit it to 30 minutes. Have a relaxing bedtime routine. Reading or listening to music may relax you and help you sleep. Use your bedroom only for sleep. Keep your television and computer out of your bedroom. Keep your bedroom cool, dark, and quiet. Use a supportive mattress and pillows. Follow your provider's instructions for other changes to sleep habits. Nutrition Do not eat big meals in the evening. Do not have caffeine in the later part of the day. The effects of caffeine can last for more than 5 hours. Follow your provider's instructions for any changes to what you eat and drink. Lifestyle Do not drink alcohol before bedtime. Alcohol can cause you to fall asleep at first, but then it can cause you to wake up in the middle of the night and have trouble getting back to sleep. Do not smoke, vape, or use nicotine or tobacco. Medicines Take over-the-counter and prescription medicines only as told by your provider. Do not use over-the-counter sleep medicine. You may become dependent on this medicine, and it can make sleep apnea worse. Do not take medicines, such as sedatives and narcotics, unless told to by your provider. Activity Exercise on most days, but avoid exercising in the evening. Exercising near bedtime can interfere with sleeping.  If possible, spend time outside every day. Natural light helps with your internal clock. General information Lose weight if you need to. Stay at a healthy weight. If you are having surgery, make sure to tell your provider that you have sleep apnea. You may need to bring your device with you. Keep all follow-up visits. Your provider will want to check on your condition. Where to find more information National Heart, Lung, and Blood  Institute: [employer] This information is not intended to replace advice given to you by your health care provider. Make sure you discuss any questions you have with your health care provider. Document Revised: 01/20/2023 Document Reviewed: 01/20/2023 Elsevier Patient Education  2024 [employer].

## 2024-07-20 NOTE — Telephone Encounter (Signed)
 Prior authorization has been sent to plan for approval or denial, waiting for response.

## 2024-07-20 NOTE — Progress Notes (Signed)
 Office: [phone]  /  Fax: [phone]  WEIGHT SUMMARY AND BIOMETRICS  Weight Lost Since Last Visit: 7 lb  Weight Gained Since Last Visit: 0   Vitals Temp: 98.4 F (36.9 C) BP: 134/84 Pulse Rate: 94 SpO2: 99 %   Anthropometric Measurements Height: 5' 3.5 (1.613 m) Weight: 269 lb (122 kg) BMI (Calculated): 46.9 Weight at Last Visit: 276 lb Weight Lost Since Last Visit: 7 lb Weight Gained Since Last Visit: 0 Starting Weight: 276 lb Total Weight Loss (lbs): 7 lb (3.175 kg) Peak Weight: 290 lb   Body Composition  Body Fat %: 50.8 % Fat Mass (lbs): 136.8 lbs Muscle Mass (lbs): 125.8 lbs Total Body Water (lbs): 97.6 lbs Visceral Fat Rating : 16   Other Clinical Data RMR: [id number] Fasting: no Labs: no Today's Visit #: 2 Starting Date: 07/06/24    Total Weight Loss: 7 pounds Percent of body weight lost: 2.5 % Bio Impedance Data reviewed with patient: Down 5.4 pounds of adipose, down 1.2 pounds of muscle.  HPI  Chief Complaint: OBESITY  Durene is here to discuss her progress with her obesity treatment plan. She is on the the Category 3 Plan and states she is following her eating plan approximately 90 % of the time. She states she is not currently exercising   Interval History:  Since last office visit she has seen hematology and will be getting scheduled for an iron transfusion. She has been finding it very manageable to eat on plan of 1500 calories and 100-120 grams of protein.  No fried food or fast food.  No sweet drinks. She is getting at least 64 ounces of water. She has not had hunger.  She has been getting steps at work but has not yet started exercise. She plans to do 2 days a week for 30 minutes on the elliptical   She does have severe sleep apnea but did not get CPAP due to cost. She has marked daytime somnolence, she does snore and wakes up tired with an occasional morning headache. She has had witnessed periods of apnea while sleeping.  AHI 63.6  with O2 nadir of 63% on home sleep study 01/15/24. Mallampati class 4 , Neck size 17.5 inches Epworth Sleepiness Scale score today is 17  Cancer Screenings: Eje:nczm 5 years Mammogram: within last 2 years  ASCVD Risk The 10-year ASCVD risk score (Arnett DK, et al., 2019) is: 0.9%- low risk   Values used to calculate the score:     Age: 43 years     Clincally relevant sex: Female     Is Non-Hispanic African American: Yes     Diabetic: No     Tobacco smoker: No     Systolic Blood Pressure: 139 mmHg     Is BP treated: No     HDL Cholesterol: 58 mg/dL     Total Cholesterol: 186 mg/dL   Labs are reviewed with patient in detail. Iron was low at 7 with decreased hemoglobin and hematocrit- was referred to hematology and had appointment 2 days ago for further evaluation, has had previous iron transfusions. A1c is prediabetic and elevated fasting insulin  and HOMA-IR score of 6.69 indicating insulin  resistance. Vit D level is low and has been started on ergocalciferol  50000 units once a week. Vit B12 is elevated and will be further evaluated at hematology as can be due to iron deficiency anemia. Electrolytes, kidney and liver functions, thyroid  are all normal.  PHYSICAL EXAM:  Blood pressure 134/84, pulse 94, temperature  98.4 F (36.9 C), height 5' 3.5 (1.613 m), weight 269 lb (122 kg), SpO2 99%, unknown if currently breastfeeding. Body mass index is 46.9 kg/m.  General: Well Developed, well nourished, and in no acute distress.  HEENT: Normocephalic, atraumatic; EOMI, sclerae are anicteric. Skin: Warm and dry, good turgor Chest:  Normal excursion, shape, no gross ABN Respiratory: No conversational dyspnea; speaking in full sentences NeuroM-Sk:  Normal gross ROM * 4 extremities  Psych: A and O X 3, insight adequate, mood- full    DIAGNOSTIC DATA REVIEWED:  Last metabolic panel Lab Results  Component Value Date   GLUCOSE 90 07/18/2024   NA 136 07/18/2024   K 3.9 07/18/2024   CL 100  07/18/2024   CO2 25 07/18/2024   BUN 7 07/18/2024   CREATININE 0.59 07/18/2024   GFRNONAA >60 07/18/2024   CALCIUM 9.6 07/18/2024   PROT 8.2 (H) 07/18/2024   ALBUMIN 4.5 07/18/2024   LABGLOB 3.2 07/06/2024   BILITOT 0.4 07/18/2024   ALKPHOS 102 07/18/2024   AST 25 07/18/2024   ALT 19 07/18/2024   ANIONGAP 11 07/18/2024     Lab Results  Component Value Date   HGBA1C 6.2 (H) 07/06/2024   Lab Results  Component Value Date   INSULIN  30.1 (H) 07/06/2024   Lab Results  Component Value Date   TSH 1.310 07/18/2024   CBC    Component Value Date/Time   WBC 4.7 07/18/2024 1456   WBC 5.3 07/10/2022 2053   RBC 5.36 (H) 07/18/2024 1456   HGB 9.9 (L) 07/18/2024 1456   HGB 10.5 (L) 07/06/2024 1003   HCT 33.6 (L) 07/18/2024 1456   HCT 36.7 07/06/2024 1003   PLT 349 07/18/2024 1456   PLT 433 07/06/2024 1003   MCV 62.7 (L) 07/18/2024 1456   MCV 65 (L) 07/06/2024 1003   MCH 18.5 (L) 07/18/2024 1456   MCHC 29.5 (L) 07/18/2024 1456   RDW 19.3 (H) 07/18/2024 1456   RDW 18.6 (H) 07/06/2024 1003   Iron Studies    Component Value Date/Time   IRON 32 07/18/2024 1457   IRON 33 07/06/2024 1003   TIBC 543 (H) 07/18/2024 1457   TIBC 465 (H) 07/06/2024 1003   FERRITIN 11 07/18/2024 1457   FERRITIN 10 (L) 07/06/2024 1003   IRONPCTSAT 6 (L) 07/18/2024 1457   IRONPCTSAT 7 (LL) 07/06/2024 1003   Lipid Panel     Component Value Date/Time   CHOL 186 07/06/2024 1003   TRIG 99 07/06/2024 1003   HDL 58 07/06/2024 1003   CHOLHDL 3.2 07/06/2024 1003   LDLCALC 110 (H) 07/06/2024 1003     Nutritional Lab Results  Component Value Date   VD25OH 4.9 (L) 07/06/2024   Lab Results  Component Value Date   VITAMINB12 2,084 (H) 07/18/2024     ASSESSMENT AND PLAN  Class 3 severe obesity with serious comorbidity and body mass index (BMI) of 45.0 to 49.9 in adult, unspecified obesity type (HCC)  TREATMENT PLAN FOR OBESITY:  Recommended Dietary Goals  Marcie is currently in the action  stage of change. As such, her goal is to continue weight management plan. She has agreed to the Category 3 Plan.  Behavioral Intervention  We discussed the following Behavioral Modification Strategies today: work on tracking and journaling calories using tracking application, decreasing eating out or consumption of processed foods, and making healthy choices when eating convenient foods, continue to work on maintaining a reduced calorie state, getting the recommended amount of protein, incorporating whole foods,  making healthy choices, staying well hydrated and practicing mindfulness when eating., and increase protein intake, fibrous foods (25 grams per day for women, 30 grams for men) and water to improve satiety and decrease hunger signals. .  Additional resources provided today: NA  Recommended Physical Activity Goals  Omer has been advised to work up to 150 minutes of moderate intensity aerobic activity a week and strengthening exercises 2-3 times per week for cardiovascular health, weight loss maintenance and preservation of muscle mass.   She has agreed to Think about enjoyable ways to increase daily physical activity and overcoming barriers to exercise, Increase physical activity in their day and reduce sedentary time (increase NEAT)., and Start aerobic activity with a goal of 150 minutes a week at moderate intensity.    Pharmacotherapy We discussed various medication options to help Melessia with her weight loss efforts and we both agreed to start Zepbound for Severe sleep apnea and weight loss.  ASSOCIATED CONDITIONS ADDRESSED TODAY  Action/Plan  Severe obstructive sleep apnea due to class 3 obesity (HCC) Patient has severe sleep apnea from home sleep study- AHI of 63.6 and O2 nadir of 63%. She has a mallampati class of 4 and neck size of 17.5 inches.  All of these factors make her very high risk for worsening medical conditions and sudden death. As per FDA approval of Zepbound use  in severe sleep apnea will start Zepbound 2.5 mg SQ QW -     Zepbound; Inject 2.5 mg into the skin once a week.  Dispense: 2 mL; Refill: 0  Iron deficiency anemia due to chronic blood loss       Continue to follow with hematology       She is being scheduled for iron transfusion  Vitamin D  deficiency Continue to supplement with Ergocalciferol  50000 units once a week for 12 weeks, will recheck in 09/2024  Prediabetes Insulin  resistance Continue Category 3  meal plan, limit simple carbohydrates Decreasing body weight by 10-15% can improve glucose levels Start aerobic activity with initial goal of 30 minutes 2 days a week on the elliptical  Pure hypercholesterolemia Focus on implementing category 3 meal plan, limit saturated fats Loss of 10-15% body weight can improve lipid levels Start aerobic activity with initial goal of 30 minutes 2 days a week on the elliptical           Return in about 3 weeks (around 08/10/2024).SABRA She was informed of the importance of frequent follow up visits to maximize her success with intensive lifestyle modifications for her multiple health conditions.   ATTESTASTION STATEMENTS:  Reviewed by clinician on day of visit: allergies, medications, problem list, medical history, surgical history, family history, social history, and previous encounter notes.   I personally spent a total of 61 minutes in the care of the patient today including preparing to see the patient, getting/reviewing separately obtained history, performing a medically appropriate exam/evaluation, counseling and educating, placing orders, referring and communicating with other health care professionals, documenting clinical information in the EHR, independently interpreting results, communicating results, and coordinating care.   Kaneshia Cater ANP-C

## 2024-07-21 ENCOUNTER — Other Ambulatory Visit (HOSPITAL_BASED_OUTPATIENT_CLINIC_OR_DEPARTMENT_OTHER): Payer: Self-pay

## 2024-07-24 ENCOUNTER — Encounter (INDEPENDENT_AMBULATORY_CARE_PROVIDER_SITE_OTHER): Payer: Self-pay

## 2024-07-24 ENCOUNTER — Other Ambulatory Visit (HOSPITAL_BASED_OUTPATIENT_CLINIC_OR_DEPARTMENT_OTHER): Payer: Self-pay

## 2024-07-26 ENCOUNTER — Other Ambulatory Visit (HOSPITAL_BASED_OUTPATIENT_CLINIC_OR_DEPARTMENT_OTHER): Payer: Self-pay

## 2024-07-27 ENCOUNTER — Encounter: Payer: Self-pay | Admitting: Oncology

## 2024-07-27 ENCOUNTER — Other Ambulatory Visit: Payer: Self-pay

## 2024-07-27 ENCOUNTER — Other Ambulatory Visit (HOSPITAL_BASED_OUTPATIENT_CLINIC_OR_DEPARTMENT_OTHER): Payer: Self-pay

## 2024-07-27 ENCOUNTER — Other Ambulatory Visit (HOSPITAL_COMMUNITY): Payer: Self-pay

## 2024-07-31 NOTE — Telephone Encounter (Signed)
 She's calling about this same thing. She says that she has more information for you.

## 2024-08-01 NOTE — Telephone Encounter (Signed)
 Still working on this?

## 2024-08-09 NOTE — Telephone Encounter (Signed)
 [name], This patient is asking about zepboud samples, however I do not see where you mentioned it in your note or prescribed it.  She is seeing [facility] [facility], they sent in a PA for the Zepbound and her insurance does not cover it.  They are now sending the PA to [facility].  Please advise regarding samples.  Thank you.

## 2024-08-10 ENCOUNTER — Encounter (INDEPENDENT_AMBULATORY_CARE_PROVIDER_SITE_OTHER): Payer: Self-pay | Admitting: Nurse Practitioner

## 2024-08-10 ENCOUNTER — Ambulatory Visit (INDEPENDENT_AMBULATORY_CARE_PROVIDER_SITE_OTHER): Admitting: Nurse Practitioner

## 2024-08-10 VITALS — BP 146/73 | HR 82 | Temp 98.4°F | Ht 63.5 in | Wt 264.0 lb

## 2024-08-10 DIAGNOSIS — E559 Vitamin D deficiency, unspecified: Secondary | ICD-10-CM

## 2024-08-10 DIAGNOSIS — Z6841 Body Mass Index (BMI) 40.0 and over, adult: Secondary | ICD-10-CM

## 2024-08-10 DIAGNOSIS — D500 Iron deficiency anemia secondary to blood loss (chronic): Secondary | ICD-10-CM | POA: Diagnosis not present

## 2024-08-10 DIAGNOSIS — E66813 Obesity, class 3: Secondary | ICD-10-CM

## 2024-08-10 DIAGNOSIS — G4733 Obstructive sleep apnea (adult) (pediatric): Secondary | ICD-10-CM

## 2024-08-10 DIAGNOSIS — K219 Gastro-esophageal reflux disease without esophagitis: Secondary | ICD-10-CM

## 2024-08-10 DIAGNOSIS — E88819 Insulin resistance, unspecified: Secondary | ICD-10-CM | POA: Diagnosis not present

## 2024-08-10 MED ORDER — FAMOTIDINE 40 MG PO TABS: 40.0000 mg | ORAL_TABLET | Freq: Every day | ORAL | 0 refills | Status: DC

## 2024-08-10 MED ORDER — TIRZEPATIDE-WEIGHT MANAGEMENT 2.5 MG/0.5ML ~~LOC~~ SOLN: 2.5000 mg | SUBCUTANEOUS | 0 refills | Status: DC

## 2024-08-10 MED ORDER — VITAMIN D (ERGOCALCIFEROL) 1.25 MG (50000 UNIT) PO CAPS: 50000.0000 [IU] | ORAL_CAPSULE | ORAL | 1 refills | Status: DC

## 2024-08-10 NOTE — Progress Notes (Signed)
 Office: [phone]  /  Fax: [phone]  WEIGHT SUMMARY AND BIOMETRICS  Weight Lost Since Last Visit: 5 lb  Weight Gained Since Last Visit: 0   Vitals Temp: 98.4 F (36.9 C) BP: (!) 146/73 Pulse Rate: 82 SpO2: 96 %   Anthropometric Measurements Height: 5' 3.5 (1.613 m) Weight: 264 lb (119.7 kg) BMI (Calculated): 46.03 Weight at Last Visit: 269 LB Weight Lost Since Last Visit: 5 lb Weight Gained Since Last Visit: 0 Starting Weight: 276 LB Total Weight Loss (lbs): 12 lb (5.443 kg) Peak Weight: 290 LB   Body Composition  Body Fat %: 49.9 % Fat Mass (lbs): 131.8 lbs Muscle Mass (lbs): 125.8 lbs Total Body Water (lbs): 97.4 lbs Visceral Fat Rating : 16   Other Clinical Data Fasting: NO Labs: NO Today's Visit #: 3 Starting Date: 07/06/24    Total Weight Loss:12 Pounds Percent of body weight lost:4.3%   Bio Impedance Data reviewed with patient:Muscle is the same, adipose is down 5 pounds, PBF decreased from 50.8% to 49.9%, visceral fat rating is the same at 16  HPI  Chief Complaint: OBESITY  Tara Farrell is here to discuss her progress with her obesity treatment plan. She is on the the Category 3 Plan and states she is following her eating plan approximately 90 % of the time. She states she is exercising 15-20 minutes 2 days per week.   Interval History:  Since last office visit she started the elliptical for 15-20 minutes 2 days a week.  She has been getting her protein goal of 100 grams of protein daily. She is trying to get 64 ounces of water daily. She has not skipped any meals. She is interested in doing Zepbound through [facility] and doing self pay- insurance would not approve for severe sleep apnea and BMI of 46  She continues on Ferrous Sulfate  325 mg QAM for iron deficiency anemia. She does follow with hematology She is on ergocalciferol  50000 units once a week for Vit D deficiency and denies side effects.  She was prescribed Zepbound for severe  sleep apnea and BMI > 40 but was not approved through her insurance.  She has had a long and frustrating day at work. BP when she checks at home is usually in 120-130's/70-80. Denies headches, chest pain and shortness of breath BP Readings from Last 3 Encounters:  08/10/24 (!) 146/73  07/20/24 134/84  07/18/24 139/85     PHYSICAL EXAM:  Blood pressure (!) 146/73, pulse 82, temperature 98.4 F (36.9 C), height 5' 3.5 (1.613 m), weight 264 lb (119.7 kg), SpO2 96%, unknown if currently breastfeeding. Body mass index is 46.03 kg/m.  General: Well Developed, well nourished, and in no acute distress.  HEENT: Normocephalic, atraumatic; EOMI, sclerae are anicteric. Skin: Warm and dry, good turgor Chest:  Normal excursion, shape, no gross ABN Respiratory: No conversational dyspnea; speaking in full sentences NeuroM-Sk:  Normal gross ROM * 4 extremities  Psych: A and O X 3, insight adequate, mood- full    DIAGNOSTIC DATA REVIEWED:  BMET    Component Value Date/Time   NA 136 07/18/2024 1456   NA 136 07/06/2024 1003   K 3.9 07/18/2024 1456   CL 100 07/18/2024 1456   CO2 25 07/18/2024 1456   GLUCOSE 90 07/18/2024 1456   BUN 7 07/18/2024 1456   BUN 7 07/06/2024 1003   CREATININE 0.59 07/18/2024 1456   CALCIUM 9.6 07/18/2024 1456   GFRNONAA >60 07/18/2024 1456   GFRAA >90 06/16/2013 0545  Lab Results  Component Value Date   HGBA1C 6.2 (H) 07/06/2024   Lab Results  Component Value Date   INSULIN  30.1 (H) 07/06/2024   Lab Results  Component Value Date   TSH 1.310 07/18/2024   CBC    Component Value Date/Time   WBC 4.7 07/18/2024 1456   WBC 5.3 07/10/2022 2053   RBC 5.36 (H) 07/18/2024 1456   HGB 9.9 (L) 07/18/2024 1456   HGB 10.5 (L) 07/06/2024 1003   HCT 33.6 (L) 07/18/2024 1456   HCT 36.7 07/06/2024 1003   PLT 349 07/18/2024 1456   PLT 433 07/06/2024 1003   MCV 62.7 (L) 07/18/2024 1456   MCV 65 (L) 07/06/2024 1003   MCH 18.5 (L) 07/18/2024 1456   MCHC 29.5 (L)  07/18/2024 1456   RDW 19.3 (H) 07/18/2024 1456   RDW 18.6 (H) 07/06/2024 1003   Iron Studies    Component Value Date/Time   IRON 32 07/18/2024 1457   IRON 33 07/06/2024 1003   TIBC 543 (H) 07/18/2024 1457   TIBC 465 (H) 07/06/2024 1003   FERRITIN 11 07/18/2024 1457   FERRITIN 10 (L) 07/06/2024 1003   IRONPCTSAT 6 (L) 07/18/2024 1457   IRONPCTSAT 7 (LL) 07/06/2024 1003   Lipid Panel     Component Value Date/Time   CHOL 186 07/06/2024 1003   TRIG 99 07/06/2024 1003   HDL 58 07/06/2024 1003   CHOLHDL 3.2 07/06/2024 1003   LDLCALC 110 (H) 07/06/2024 1003   Hepatic Function Panel     Component Value Date/Time   PROT 8.2 (H) 07/18/2024 1456   PROT 7.7 07/06/2024 1003   ALBUMIN 4.5 07/18/2024 1456   ALBUMIN 4.5 07/06/2024 1003   AST 25 07/18/2024 1456   ALT 19 07/18/2024 1456   ALKPHOS 102 07/18/2024 1456   BILITOT 0.4 07/18/2024 1456      Component Value Date/Time   TSH 1.310 07/18/2024 1456   TSH 1.620 07/06/2024 1003   Nutritional Lab Results  Component Value Date   VD25OH 4.9 (L) 07/06/2024     ASSESSMENT AND PLAN  Class 3 severe obesity with serious comorbidity and body mass index (BMI) of 45.0 to 49.9 in adult, unspecified obesity type (HCC) TREATMENT PLAN FOR OBESITY:  Recommended Dietary Goals  Alia is currently in the action stage of change. As such, her goal is to continue weight management plan. She has agreed to the Category 3 Plan.  Behavioral Intervention  We discussed the following Behavioral Modification Strategies today: increasing water intake , continue to work on maintaining a reduced calorie state, getting the recommended amount of protein, incorporating whole foods, making healthy choices, staying well hydrated and practicing mindfulness when eating., and increase protein intake, fibrous foods (25 grams per day for women, 30 grams for men) and water to improve satiety and decrease hunger signals. .    Recommended Physical Activity  Goals  Jenesys has been advised to work up to 150 minutes of moderate intensity aerobic activity a week and strengthening exercises 2-3 times per week for cardiovascular health, weight loss maintenance and preservation of muscle mass.   She has agreed to Think about enjoyable ways to increase daily physical activity and overcoming barriers to exercise, Increase physical activity in their day and reduce sedentary time (increase NEAT)., and Continue aerobic activity with a goal of 150 minutes a week at moderate intensity.    Pharmacotherapy We discussed various medication options to help Mery with her weight loss efforts and we both agreed to  start Zepbound 2.5 mg SQ QW through [facility] for weight loss- counseled on side effects and use of medication.   ASSOCIATED CONDITIONS ADDRESSED TODAY  Action/Plan  Severe obstructive sleep apnea due to class 3 obesity (HCC)     Continue Category 3 meal plan  and exercise for weight loss       Loss of 10-15% body weight can help improve OSA   Vitamin D  deficiency Continue supplement of Ergocalciferol  50000 units once a week- denies side effects -     Vitamin D  (Ergocalciferol ); Take 1 capsule (50,000 Units total) by mouth every 7 (seven) days.  Dispense: 5 capsule; Refill: 1  Insulin  resistance Continue Category 3  meal plan, limit simple carbohydrates Decreasing body weight by 10-15% can improve glucose levels Continue exercise with current goal of 150 minutes of moderate to high intensity exercise/week.   Class 3 severe obesity with serious comorbidity and body mass index (BMI) of 45.0 to 49.9 in adult, unspecified obesity type [facility]) See plan above -     Tirzepatide-Weight Management; Inject 2.5 mg into the skin once a week.  Dispense: 2 mL; Refill: 0  Iron deficiency anemia due to chronic blood loss       Continue Ferrous sulfate  325 mg and increase to bid if tolerated       Continue to follow with hematology  Gastroesophageal reflux  disease without esophagitis Continue Famotidine  40 mg every day , avoid triggering foods and do not lay down for at least 2 hours after eating -     Famotidine ; Take 1 tablet (40 mg total) by mouth daily.  Dispense: 90 tablet; Refill: 0         Return in about 4 weeks (around 09/07/2024).SABRA She was informed of the importance of frequent follow up visits to maximize her success with intensive lifestyle modifications for her multiple health conditions.   ATTESTASTION STATEMENTS:  Reviewed by clinician on day of visit: allergies, medications, problem list, medical history, surgical history, family history, social history, and previous encounter notes.     Lashunda Greis ANP-C

## 2024-08-10 NOTE — Telephone Encounter (Signed)
 LOV 02/2024 will need ov to discuss, also was started on CPAP so need rov  Can be virtual visit. Have opening tomorrow.

## 2024-08-17 ENCOUNTER — Encounter: Payer: Self-pay | Admitting: Oncology

## 2024-08-17 NOTE — Progress Notes (Signed)
 Tara Farrell CONSULT NOTE  Patient Care Team: Tara Pellet, MD as PCP - General (Family Medicine)  ASSESSMENT & PLAN:  Anemia: She has a history of iron deficient anemia requiring IV iron infusions in the past. She notes that she continues to have heavy menstrual periods and was scheduled for gynecologic procedure when she was noted to be anemic again. CBC today reveals hemoglobin 9.9 today. Iron studies are pending; however, I feel confident she will need repeat iron. We will plan for IV Feraheme  as she tolerated this well in the past.    All questions were answered. The patient knows to call the clinic with any problems, questions or concerns.  The total time spent in the appointment was 45 minutes encounter with patients including review of chart and various tests results, discussions about plan of care and coordination of care plan  Tara DELENA Bach, NP 07/18/2024   CHIEF COMPLAINTS/PURPOSE OF CONSULTATION:  Anemia  HISTORY OF PRESENTING ILLNESS:  Tara Farrell 43 y.o. female is here because of anemia  She was found to have abnormal CBC from 10/07 She denies recent chest pain on exertion, shortness of breath on minimal exertion, pre-syncopal episodes, or palpitations. She had not noticed any recent bleeding such as epistaxis, hematuria or hematochezia The patient denies over the counter NSAID ingestion. She is not on antiplatelets agents. Her last colonoscopy was N/A She had no prior history or diagnosis of cancer. Her age appropriate screening programs are up-to-date. She denies any pica and eats a variety of diet. She never donated blood or received blood transfusion The patient was prescribed oral iron supplements and she takes ferrous sulfate  325 mg three times a day.  MEDICAL HISTORY:  Past Medical History:  Diagnosis Date   Acid reflux    Anemia    Asthma    COVID-19 long hauler    GERD (gastroesophageal reflux disease)    Heartburn    Infertility,  female    Medical history non-contributory    OSA (obstructive sleep apnea)    Preterm labor    18 week SAB   SOB (shortness of breath)     SURGICAL HISTORY: Past Surgical History:  Procedure Laterality Date   CERVICAL CERCLAGE N/A 03/21/2013   Procedure: CERCLAGE CERVICAL;  Surgeon: Charlie JINNY Flowers, MD;  Location: [facility];  Service: Gynecology;  Laterality: N/A;  EDD: 09/25/13   DIAGNOSTIC LAPAROSCOPY     DILATION AND CURETTAGE OF UTERUS      SOCIAL HISTORY: Social History   Socioeconomic History   Marital status: Married    Spouse name: Donnice   Number of children: 1   Years of education: 14   Highest education level: Some college, no degree  Occupational History   Occupation: Advertising Account Executive  Tobacco Use   Smoking status: Never   Smokeless tobacco: Never  Vaping Use   Vaping status: Never Used  Substance and Sexual Activity   Alcohol use: No   Drug use: No   Sexual activity: Not Currently    Birth control/protection: None  Other Topics Concern   Not on file  Social History Narrative   Not on file   Social Drivers of Health   Financial Resource Strain: Not on file  Food Insecurity: Not on file  Transportation Needs: Not on file  Physical Activity: Not on file  Stress: Not on file  Social Connections: Not on file  Intimate Partner Violence: Not on file    FAMILY HISTORY: Family History  Problem  Relation Age of Onset   Obesity Mother    Hypertension Mother    Thyroid  disease Mother    Alcohol abuse Father     ALLERGIES:  has no known allergies.  MEDICATIONS:  Current Outpatient Medications  Medication Sig Dispense Refill   albuterol  (VENTOLIN  HFA) 108 (90 Base) MCG/ACT inhaler Inhale 1-2 puffs into the lungs every 4 (four) hours as needed for wheezing or shortness of breath. 1 each 3   ferrous sulfate  325 (65 FE) MG tablet Take one tablet a day for five days. If you are tolerating it, increase to twice a day. If after another five days you are  still tolerating it, increase to three times a day. At any point if you develop intolerable side effects, go back to the lower dose. 90 tablet 0   fluticasone  furoate-vilanterol (BREO ELLIPTA ) 100-25 MCG/ACT AEPB Inhale 1 puff into the lungs daily. 30 each 11   montelukast  (SINGULAIR ) 10 MG tablet Take 1 tablet (10 mg total) by mouth at bedtime. 30 tablet 11   omeprazole (PRILOSEC) 20 MG capsule Take 20 mg by mouth daily.     famotidine  (PEPCID ) 40 MG tablet Take 1 tablet (40 mg total) by mouth daily. 90 tablet 0   tirzepatide (ZEPBOUND) 2.5 MG/0.5ML injection vial Inject 2.5 mg into the skin once a week. 2 mL 0   Vitamin D , Ergocalciferol , (DRISDOL) 1.25 MG (50000 UNIT) CAPS capsule Take 1 capsule (50,000 Units total) by mouth every 7 (seven) days. 5 capsule 1   No current facility-administered medications for this visit.    REVIEW OF SYSTEMS:   Constitutional: Denies fevers, chills or abnormal night sweats Eyes: Denies blurriness of vision, double vision or watery eyes Ears, nose, mouth, throat, and face: Denies mucositis or sore throat Respiratory: Denies cough, dyspnea or wheezes Cardiovascular: Denies palpitation, chest discomfort or lower extremity swelling Gastrointestinal:  Denies nausea, heartburn or change in bowel habits Skin: Denies abnormal skin rashes Lymphatics: Denies new lymphadenopathy or easy bruising Neurological:Denies numbness, tingling or new weaknesses Behavioral/Psych: Mood is stable, no new changes  All other systems were reviewed with the patient and are negative.  PHYSICAL EXAMINATION: ECOG PERFORMANCE STATUS: 1 - Symptomatic but completely ambulatory  Vitals:   07/18/24 1519  BP: 139/85  Pulse: 91  Resp: 18  Temp: 99.2 F (37.3 C)   Filed Weights   07/18/24 1519  Weight: 274 lb 6.4 oz (124.5 kg)    GENERAL:alert, no distress and comfortable SKIN: skin color, texture, turgor are normal, no rashes or significant lesions EYES: normal, conjunctiva  are pink and non-injected, sclera clear OROPHARYNX:no exudate, no erythema and lips, buccal mucosa, and tongue normal  NECK: supple, thyroid  normal size, non-tender, without nodularity LYMPH:  no palpable lymphadenopathy in the cervical, axillary or inguinal LUNGS: clear to auscultation and percussion with normal breathing effort HEART: regular rate & rhythm and no murmurs and no lower extremity edema ABDOMEN:abdomen soft, non-tender and normal bowel sounds Musculoskeletal:no cyanosis of digits and no clubbing  PSYCH: alert & oriented x 3 with fluent speech NEURO: no focal motor/sensory deficits  RADIOGRAPHIC STUDIES: I have personally reviewed the radiological images as listed and agreed with the findings in the report. No results found.

## 2024-08-24 ENCOUNTER — Telehealth: Payer: Self-pay

## 2024-08-24 NOTE — Telephone Encounter (Signed)
 ATC LVMTCB per [name] Parrett's request. Pt has upcoming appointment on 08/25/24, called to see if she had received her CPAP machine.

## 2024-08-25 ENCOUNTER — Telehealth: Admitting: Adult Health

## 2024-08-25 ENCOUNTER — Encounter: Payer: Self-pay | Admitting: Adult Health

## 2024-08-25 DIAGNOSIS — G4733 Obstructive sleep apnea (adult) (pediatric): Secondary | ICD-10-CM | POA: Diagnosis not present

## 2024-08-25 DIAGNOSIS — J4530 Mild persistent asthma, uncomplicated: Secondary | ICD-10-CM | POA: Diagnosis not present

## 2024-08-25 NOTE — Patient Instructions (Addendum)
 Continue on BREO 1 puff daily, rinse after use Albuterol  inhaler As needed   Continue on Singulair  daily  Continue on Claritin  daily .  Call back if you change your mind on CPAP - order for online or assistance program through DME.  Work on healthy weight loss  Do not drive if sleepy  Call back if you want to proceed with Zepbound through the assistance cash pay program.  Follow up in 6 months and As needed

## 2024-08-25 NOTE — Progress Notes (Signed)
 Virtual Visit via Video Note  I connected with Tara Farrell on 08/25/24 at  1:00 PM EST by a video enabled telemedicine application and verified that I am speaking with the correct person using two identifiers.  Location: Patient: Home  Provider: Office    I discussed the limitations of evaluation and management by telemedicine and the availability of in person appointments. The patient expressed understanding and agreed to proceed.  History of Present Illness: 43 yo female never smoker followed for Asthma and OSA  Today's video visit is a 68-month follow-up.  Patient has underlying asthma she says overall her asthma is doing well.  She remains on Breo daily.  She is also on Singulair  and Claritin  for chronic allergies.  Says overall no flare of cough or wheezing.  She says that she is breathing well.  Trying to be more active and exercising without any known difficulty.  Last visit patient was having sleep apnea symptoms.  She had been set up for a sleep study that was done in April that showed severe sleep apnea with AHI 64/hour and SpO2 low at 64%.  We recommended started on CPAP therapy.  Unfortunately patient has a high deductible insurance plan and was unaffordable to begin on CPAP.  She tried to look at patient assistance but was unable to complete request.  We discussed online option to see if this would be a more affordable option as well.  Patient is working with healthy weight and wellness on weight loss.  She was looking at information for Zepbound for sleep apnea however was unaffordable and is looking at the cash pay program as a more affordable option but continues to be anywhere between $4-$500 a month.     Observations/Objective: PFTs December 12, 2021 showed FEV1 85%, ratio 89, FVC 79%, DLCO 67%.   CT chest July 14, 2022 showed no suspicious pulmonary nodules, mildly enlarged lymph nodes above and below the diaphragm suspicious for lymphoproliferative disorder   home sleep  study done on January 15, 2024 that showed severe sleep apnea with an AHI of 64.6/hour and SpO2 at 64%.   08/25/2024 -NAD   Assessment and Plan: Severe obstructive sleep apnea with significant symptom burden.  Long discussion with patient regarding need to begin therapy.  Highly recommend beginning CPAP therapy.  Would recommend if affordable option to try to buy online as may be less expensive than going through DME company.  Also consider assistance program through the DME company to see if this is an option.  Would recommend beginning CPAP auto 5 to 15 cm H2O.  Continue with weight loss efforts.  Would be a good candidate for Zepbound she is to call us  back if she changes her mind on the cash pay program.  Morbid obesity.  Continue with healthy weight loss.  Activity as tolerated.  Continue follow-up with healthy weight and wellness  Asthma under good control-continue on current regimen  Plan  . Patient Instructions  Continue on BREO 1 puff daily, rinse after use Albuterol  inhaler As needed   Continue on Singulair  daily  Continue on Claritin  daily .  Call back if you change your mind on CPAP - order for online or assistance program through DME.  Work on healthy weight loss  Do not drive if sleepy  Call back if you want to proceed with Zepbound through the assistance cash pay program.  Follow up in 6 months and As needed       Follow Up Instructions:  I discussed the assessment and treatment plan with the patient. The patient was provided an opportunity to ask questions and all were answered. The patient agreed with the plan and demonstrated an understanding of the instructions.   The patient was advised to call back or seek an in-person evaluation if the symptoms worsen or if the condition fails to improve as anticipated.  I provided 22  minutes of non-face-to-face time during this encounter.   Madelin Stank, NP

## 2024-08-30 ENCOUNTER — Other Ambulatory Visit (INDEPENDENT_AMBULATORY_CARE_PROVIDER_SITE_OTHER): Payer: Self-pay | Admitting: Nurse Practitioner

## 2024-08-30 DIAGNOSIS — E66813 Obesity, class 3: Secondary | ICD-10-CM

## 2024-09-04 ENCOUNTER — Ambulatory Visit (INDEPENDENT_AMBULATORY_CARE_PROVIDER_SITE_OTHER): Admitting: Nurse Practitioner

## 2024-09-04 ENCOUNTER — Encounter (INDEPENDENT_AMBULATORY_CARE_PROVIDER_SITE_OTHER): Payer: Self-pay | Admitting: Nurse Practitioner

## 2024-09-04 VITALS — BP 134/84 | HR 91 | Temp 99.3°F | Ht 63.5 in | Wt 258.0 lb

## 2024-09-04 DIAGNOSIS — Z6841 Body Mass Index (BMI) 40.0 and over, adult: Secondary | ICD-10-CM

## 2024-09-04 DIAGNOSIS — E88819 Insulin resistance, unspecified: Secondary | ICD-10-CM | POA: Diagnosis not present

## 2024-09-04 DIAGNOSIS — E66813 Obesity, class 3: Secondary | ICD-10-CM | POA: Diagnosis not present

## 2024-09-04 DIAGNOSIS — E559 Vitamin D deficiency, unspecified: Secondary | ICD-10-CM

## 2024-09-04 DIAGNOSIS — D500 Iron deficiency anemia secondary to blood loss (chronic): Secondary | ICD-10-CM | POA: Diagnosis not present

## 2024-09-04 MED ORDER — TIRZEPATIDE-WEIGHT MANAGEMENT 2.5 MG/0.5ML ~~LOC~~ SOLN: 2.5000 mg | SUBCUTANEOUS | 0 refills | Status: DC

## 2024-09-04 MED ORDER — VITAMIN D (ERGOCALCIFEROL) 1.25 MG (50000 UNIT) PO CAPS: 50000.0000 [IU] | ORAL_CAPSULE | ORAL | 1 refills | Status: DC

## 2024-09-04 NOTE — Progress Notes (Signed)
 Office: [phone]  /  Fax: [phone]  WEIGHT SUMMARY AND BIOMETRICS  Weight Lost Since Last Visit: 6 lb  Weight Gained Since Last Visit: 0   Vitals Temp: 99.3 F (37.4 C) BP: 134/84 Pulse Rate: 91 SpO2: 99 %   Anthropometric Measurements Height: 5' 3.5 (1.613 m) Weight: 258 lb (117 kg) BMI (Calculated): 44.98 Weight at Last Visit: 264 lb Weight Lost Since Last Visit: 6 lb Weight Gained Since Last Visit: 0 Starting Weight: 276 lb Total Weight Loss (lbs): 18 lb (8.165 kg) Peak Weight: 290 lb Waist Measurement : 50.3 inches   Body Composition  Body Fat %: 50.3 % Fat Mass (lbs): 130.2 lbs Muscle Mass (lbs): 122.2 lbs Total Body Water (lbs): 96 lbs Visceral Fat Rating : 16   Other Clinical Data Fasting: no Labs: no Today's Visit #: 4 Starting Date: 07/06/24    Total Weight Loss: 18 pounds Percent of body weight lost: 6.5%  Bio Impedance Data reviewed with patient: Muscle is down 3.6 pounds, adipose is down 1.6 pounds  HPI  Chief Complaint: OBESITY  Tara Farrell is here to discuss her progress with her obesity treatment plan. She is on the the Category 3 Plan and states she is following her eating plan approximately 75 % of the time. She states she is exercising 20 minutes 1 days per week.   Interval History:  Since last office visit she has been getting 100 grams of Protein. She is getting at least 50 ounces of water a day. She is not skipping meals. She has started making more meals and her family is eating as well.  She gets 6 hours of continuous sleep. She has been walking 1 day a week but does plan to increase frequency. She feels content on her meal plan, not hungry or having cravings.   She continues on Ferrous Sulfate  325 mg 2 caps daily for iron deficiency anemia- mild constipation but is managed with Senokot. Continues on Ergocalciferol  50000 units once a week for Vit D deficiency and denies side effects.   She is going to the beach for  Thanksgiving. She has her snacks and sparkling water packed to go with her. They plan to go out to eat. Discussed celebration strategies.   Pharmacotherapy for weight loss: She is currently taking Zepbound  for medical weight loss.  Denies side effects.      PHYSICAL EXAM:  Blood pressure 134/84, pulse 91, temperature 99.3 F (37.4 C), height 5' 3.5 (1.613 m), weight 258 lb (117 kg), SpO2 99%, unknown if currently breastfeeding. Body mass index is 44.99 kg/m.  General: Well Developed, well nourished, and in no acute distress.  HEENT: Normocephalic, atraumatic; EOMI, sclerae are anicteric. Skin: Warm and dry, good turgor Chest:  Normal excursion, shape, no gross ABN Respiratory: No conversational dyspnea; speaking in full sentences NeuroM-Sk:  Normal gross ROM * 4 extremities  Psych: A and O X 3, insight adequate, mood- full    DIAGNOSTIC DATA REVIEWED:  BMET    Component Value Date/Time   NA 136 07/18/2024 1456   NA 136 07/06/2024 1003   K 3.9 07/18/2024 1456   CL 100 07/18/2024 1456   CO2 25 07/18/2024 1456   GLUCOSE 90 07/18/2024 1456   BUN 7 07/18/2024 1456   BUN 7 07/06/2024 1003   CREATININE 0.59 07/18/2024 1456   CALCIUM 9.6 07/18/2024 1456   GFRNONAA >60 07/18/2024 1456   GFRAA >90 06/16/2013 0545   Lab Results  Component Value Date   HGBA1C 6.2 (H)  07/06/2024   Lab Results  Component Value Date   INSULIN  30.1 (H) 07/06/2024   Lab Results  Component Value Date   TSH 1.310 07/18/2024   CBC    Component Value Date/Time   WBC 4.7 07/18/2024 1456   WBC 5.3 07/10/2022 2053   RBC 5.36 (H) 07/18/2024 1456   HGB 9.9 (L) 07/18/2024 1456   HGB 10.5 (L) 07/06/2024 1003   HCT 33.6 (L) 07/18/2024 1456   HCT 36.7 07/06/2024 1003   PLT 349 07/18/2024 1456   PLT 433 07/06/2024 1003   MCV 62.7 (L) 07/18/2024 1456   MCV 65 (L) 07/06/2024 1003   MCH 18.5 (L) 07/18/2024 1456   MCHC 29.5 (L) 07/18/2024 1456   RDW 19.3 (H) 07/18/2024 1456   RDW 18.6 (H)  07/06/2024 1003   Iron Studies    Component Value Date/Time   IRON 32 07/18/2024 1457   IRON 33 07/06/2024 1003   TIBC 543 (H) 07/18/2024 1457   TIBC 465 (H) 07/06/2024 1003   FERRITIN 11 07/18/2024 1457   FERRITIN 10 (L) 07/06/2024 1003   IRONPCTSAT 6 (L) 07/18/2024 1457   IRONPCTSAT 7 (LL) 07/06/2024 1003   Lipid Panel     Component Value Date/Time   CHOL 186 07/06/2024 1003   TRIG 99 07/06/2024 1003   HDL 58 07/06/2024 1003   CHOLHDL 3.2 07/06/2024 1003   LDLCALC 110 (H) 07/06/2024 1003   Hepatic Function Panel     Component Value Date/Time   PROT 8.2 (H) 07/18/2024 1456   PROT 7.7 07/06/2024 1003   ALBUMIN 4.5 07/18/2024 1456   ALBUMIN 4.5 07/06/2024 1003   AST 25 07/18/2024 1456   ALT 19 07/18/2024 1456   ALKPHOS 102 07/18/2024 1456   BILITOT 0.4 07/18/2024 1456      Component Value Date/Time   TSH 1.310 07/18/2024 1456   TSH 1.620 07/06/2024 1003   Nutritional Lab Results  Component Value Date   VD25OH 4.9 (L) 07/06/2024     ASSESSMENT AND PLAN  Class 3 severe obesity with serious comorbidity and body mass index (BMI) of 45.0 to 49.9 in adult, unspecified obesity type (HCC) TREATMENT PLAN FOR OBESITY:  Recommended Dietary Goals  Maille is currently in the action stage of change. As such, her goal is to continue weight management plan. She has agreed to the Category 3 Plan.  Behavioral Intervention  We discussed the following Behavioral Modification Strategies today: keeping healthy foods at home, decreasing eating out or consumption of processed foods, and making healthy choices when eating convenient foods, practice mindfulness eating and understand the difference between hunger signals and cravings, continue to practice mindfulness when eating, celebration eating strategies- one plate with protein as largest portion, clean vegetable and then portion of anything she wants to try but food cannot touch on the plate, be mindful and enjoy your food  ,  continue to work on maintaining a reduced calorie state, getting the recommended amount of protein, incorporating whole foods, making healthy choices, staying well hydrated and practicing mindfulness when eating., and increase protein intake, fibrous foods (25 grams per day for women, 30 grams for men) and water to improve satiety and decrease hunger signals. .    Recommended Physical Activity Goals  Everlina has been advised to work up to 150 minutes of moderate intensity aerobic activity a week and strengthening exercises 2-3 times per week for cardiovascular health, weight loss maintenance and preservation of muscle mass.   She has agreed to Think about enjoyable  ways to increase daily physical activity and overcoming barriers to exercise, Increase physical activity in their day and reduce sedentary time (increase NEAT)., and Start aerobic activity with a goal of 150 minutes a week at moderate intensity. Add weight vest while walking or arm weights for resistance training.    Pharmacotherapy We discussed various medication options to help Dorleen with her weight loss efforts and we both agreed to stay at current dose of Zepbound  2.5 mg SQ QW.  Will plan to increase dose after the holidays due to cost.  ASSOCIATED CONDITIONS ADDRESSED TODAY  Action/Plan  Insulin  resistance Continue Category 3  meal plan, limit simple carbohydrates Decreasing body weight by 10-15% can improve glucose levels Increase exercise with current goal of 150 minutes of moderate to high intensity exercise/week, add strength training   Vitamin D  deficiency Continue to supplement with Ergocalciferol  50000 units once a week  Low vitamin D  levels can be associated with adiposity and may result in leptin resistance and weight gain. Also associated with fatigue.  Currently on vitamin D  supplementation without any adverse effects such as nausea, vomiting or muscle weakness.   -     Vitamin D  (Ergocalciferol ); Take 1 capsule  (50,000 Units total) by mouth every 7 (seven) days.  Dispense: 5 capsule; Refill: 1  Iron deficiency anemia due to chronic blood loss        Continue to follow with hematology        Continue Ferrous Sulfate  325 mg 2 caps daily        Monitor for side effects  Class 3 severe obesity with serious comorbidity and body mass index (BMI) of 45.0 to 49.9 in adult, unspecified obesity type (HCC) See plan Above -     Tirzepatide -Weight Management; Inject 2.5 mg into the skin once a week.  Dispense: 2 mL; Refill: 0         No follow-ups on file.SABRA She was informed of the importance of frequent follow up visits to maximize her success with intensive lifestyle modifications for her multiple health conditions.   ATTESTASTION STATEMENTS:  Reviewed by clinician on day of visit: allergies, medications, problem list, medical history, surgical history, family history, social history, and previous encounter notes.     Espn Zeman ANP-C

## 2024-09-05 MED ORDER — VITAMIN D (ERGOCALCIFEROL) 1.25 MG (50000 UNIT) PO CAPS: 50000.0000 [IU] | ORAL_CAPSULE | ORAL | 1 refills | Status: DC

## 2024-09-05 NOTE — Addendum Note (Signed)
 Addended by: JUDE LONELL BRAVO on: 09/05/2024 08:35 AM   Modules accepted: Orders

## 2024-10-03 ENCOUNTER — Other Ambulatory Visit (INDEPENDENT_AMBULATORY_CARE_PROVIDER_SITE_OTHER): Payer: Self-pay | Admitting: Nurse Practitioner

## 2024-10-03 ENCOUNTER — Ambulatory Visit (INDEPENDENT_AMBULATORY_CARE_PROVIDER_SITE_OTHER): Admitting: Nurse Practitioner

## 2024-10-03 ENCOUNTER — Encounter (INDEPENDENT_AMBULATORY_CARE_PROVIDER_SITE_OTHER): Payer: Self-pay | Admitting: Nurse Practitioner

## 2024-10-03 VITALS — BP 129/76 | HR 96 | Temp 98.3°F | Ht 63.5 in | Wt 255.0 lb

## 2024-10-03 DIAGNOSIS — G4733 Obstructive sleep apnea (adult) (pediatric): Secondary | ICD-10-CM | POA: Diagnosis not present

## 2024-10-03 DIAGNOSIS — E88819 Insulin resistance, unspecified: Secondary | ICD-10-CM | POA: Diagnosis not present

## 2024-10-03 DIAGNOSIS — Z6841 Body Mass Index (BMI) 40.0 and over, adult: Secondary | ICD-10-CM | POA: Diagnosis not present

## 2024-10-03 DIAGNOSIS — E66813 Obesity, class 3: Secondary | ICD-10-CM | POA: Diagnosis not present

## 2024-10-03 DIAGNOSIS — E559 Vitamin D deficiency, unspecified: Secondary | ICD-10-CM | POA: Diagnosis not present

## 2024-10-03 MED ORDER — TIRZEPATIDE-WEIGHT MANAGEMENT 5 MG/0.5ML ~~LOC~~ SOLN: 5.0000 mg | SUBCUTANEOUS | 0 refills | Status: DC

## 2024-10-03 NOTE — Progress Notes (Signed)
 " Office: [phone]  /  Fax: [phone]  WEIGHT SUMMARY AND BIOMETRICS  Weight Lost Since Last Visit: 3 lb  Weight Gained Since Last Visit: 0   Vitals Temp: 98.3 F (36.8 C) BP: 129/76 Pulse Rate: 96 SpO2: 99 %   Anthropometric Measurements Height: 5' 3.5 (1.613 m) Weight: 255 lb (115.7 kg) BMI (Calculated): 44.46 Weight at Last Visit: 258 lb Weight Lost Since Last Visit: 3 lb Weight Gained Since Last Visit: 0 Starting Weight: 276 lb Total Weight Loss (lbs): 21 lb (9.526 kg) Peak Weight: 290 lb Waist Measurement : 50.3 inches   Body Composition  Body Fat %: 50 % Fat Mass (lbs): 128 lbs Muscle Mass (lbs): 121.4 lbs Total Body Water (lbs): 96.2 lbs Visceral Fat Rating : 15   Other Clinical Data Fasting: no Labs: no Today's Visit #: 5 Starting Date: 07/06/24    Total Weight Loss: 21 pounds Percent of body weight lost: 7.6%  Bio Impedance Data reviewed with patient: Muscle is down 0.8 pounds, adipose is down 2.2 pounds. Visceral fat rating decreased 1 point from 16 to 15.   HPI  Chief Complaint: OBESITY  Tara Farrell is here to discuss her progress with her obesity treatment plan. She is on the the Category 3 Plan and states she is following her eating plan approximately 80 % of the time. She states she is walking at work 5 days a week.    Interval History:  Since last office visit she did have Thanksgiving and did do portion control.  She is doing well on Zepbound , denies side effects and does want to go up on her dose to 5 mg. She has had a lot of holiday get togethers , has been trying to limit portion sizes. She has been getting about almost 90 ounces of water a day. She is getting about 100-120 grams of protein daily. More fruits and vegetables.  She has not been skipping any meals. She will set a goal of 20 minutes of walking 2 days a week starting 10/12/24   Pharmacotherapy for weight loss: She is currently taking Zepbound  2.5 mg SQ QW for medical  weight loss.  Denies side effects.    She continues on Ergocalciferol  50000 units once a week for Vit D deficiency and denies side effects.   She does have insulin  resistance and Zepbound  for weight loss should help to improve, continues to work on nutrition, exercise and weight loss as well to help lower insulin  resistance.   She does have severe sleep apnea but does not use CPAP.  PHYSICAL EXAM:  Blood pressure 129/76, pulse 96, temperature 98.3 F (36.8 C), height 5' 3.5 (1.613 m), weight 255 lb (115.7 kg), SpO2 99%, unknown if currently breastfeeding. Body mass index is 44.46 kg/m.  General: Well Developed, well nourished, and in no acute distress.  HEENT: Normocephalic, atraumatic; EOMI, sclerae are anicteric. Skin: Warm and dry, good turgor Chest:  Normal excursion, shape, no gross ABN Respiratory: No conversational dyspnea; speaking in full sentences NeuroM-Sk:  Normal gross ROM * 4 extremities  Psych: A and O X 3, insight adequate, mood- full    DIAGNOSTIC DATA REVIEWED:  BMET    Component Value Date/Time   NA 136 07/18/2024 1456   NA 136 07/06/2024 1003   K 3.9 07/18/2024 1456   CL 100 07/18/2024 1456   CO2 25 07/18/2024 1456   GLUCOSE 90 07/18/2024 1456   BUN 7 07/18/2024 1456   BUN 7 07/06/2024 1003   CREATININE 0.59  07/18/2024 1456   CALCIUM 9.6 07/18/2024 1456   GFRNONAA >60 07/18/2024 1456   GFRAA >90 06/16/2013 0545   Lab Results  Component Value Date   HGBA1C 6.2 (H) 07/06/2024   Lab Results  Component Value Date   INSULIN  30.1 (H) 07/06/2024   Lab Results  Component Value Date   TSH 1.310 07/18/2024   CBC    Component Value Date/Time   WBC 4.7 07/18/2024 1456   WBC 5.3 07/10/2022 2053   RBC 5.36 (H) 07/18/2024 1456   HGB 9.9 (L) 07/18/2024 1456   HGB 10.5 (L) 07/06/2024 1003   HCT 33.6 (L) 07/18/2024 1456   HCT 36.7 07/06/2024 1003   PLT 349 07/18/2024 1456   PLT 433 07/06/2024 1003   MCV 62.7 (L) 07/18/2024 1456   MCV 65 (L)  07/06/2024 1003   MCH 18.5 (L) 07/18/2024 1456   MCHC 29.5 (L) 07/18/2024 1456   RDW 19.3 (H) 07/18/2024 1456   RDW 18.6 (H) 07/06/2024 1003   Iron Studies    Component Value Date/Time   IRON 32 07/18/2024 1457   IRON 33 07/06/2024 1003   TIBC 543 (H) 07/18/2024 1457   TIBC 465 (H) 07/06/2024 1003   FERRITIN 11 07/18/2024 1457   FERRITIN 10 (L) 07/06/2024 1003   IRONPCTSAT 6 (L) 07/18/2024 1457   IRONPCTSAT 7 (LL) 07/06/2024 1003   Lipid Panel     Component Value Date/Time   CHOL 186 07/06/2024 1003   TRIG 99 07/06/2024 1003   HDL 58 07/06/2024 1003   CHOLHDL 3.2 07/06/2024 1003   LDLCALC 110 (H) 07/06/2024 1003   Hepatic Function Panel     Component Value Date/Time   PROT 8.2 (H) 07/18/2024 1456   PROT 7.7 07/06/2024 1003   ALBUMIN 4.5 07/18/2024 1456   ALBUMIN 4.5 07/06/2024 1003   AST 25 07/18/2024 1456   ALT 19 07/18/2024 1456   ALKPHOS 102 07/18/2024 1456   BILITOT 0.4 07/18/2024 1456      Component Value Date/Time   TSH 1.310 07/18/2024 1456   TSH 1.620 07/06/2024 1003   Nutritional Lab Results  Component Value Date   VD25OH 4.9 (L) 07/06/2024     ASSESSMENT AND PLAN  Class 3 severe obesity with serious comorbidity and body mass index (BMI) of 40.0 to 44.9 in adult, unspecified obesity type (HCC) TREATMENT PLAN FOR OBESITY:  Recommended Dietary Goals  Tara Farrell is currently in the action stage of change. As such, her goal is to continue weight management plan. She has agreed to the Category 3 Plan.  Behavioral Intervention  We discussed the following Behavioral Modification Strategies today: increasing lean protein intake to established goals, increasing fiber rich foods, increasing water intake , celebration eating strategies, continue to work on maintaining a reduced calorie state, getting the recommended amount of protein, incorporating whole foods, making healthy choices, staying well hydrated and practicing mindfulness when eating., and increase  protein intake, fibrous foods (25 grams per day for women, 30 grams for men) and water to improve satiety and decrease hunger signals. .  She plans to maintain her weight in December - She wants to have small indulgences, but she is not going to waste her calories on things that are not wonderful. - Outside of social situations she will continue to follow a structured eating plan but enjoy herself with portion control for holiday events, parties and get-togethers - She does recognize that this strategy is to help her maintain her weight, not lose weight and in  January she will return to a structured plan for weight loss   Recommended Physical Activity Goals  Tara Farrell has been advised to work up to 150 minutes of moderate intensity aerobic activity a week and strengthening exercises 2-3 times per week for cardiovascular health, weight loss maintenance and preservation of muscle mass.   She has agreed to Patient will begin to exercise by walking 20 minutes 2 days a week   Pharmacotherapy We discussed various medication options to help Tara Farrell with her weight loss efforts and we both agreed to increase Zepbound  to 5 mg SQ QW- denies side effects of abdominal pain, constipation, difficulty swallowing or change in voice.  ASSOCIATED CONDITIONS ADDRESSED TODAY  Action/Plan  Vitamin D  deficiency       Continue to supplement with Ergocalciferol  50000 units once a week        Low vitamin D  levels can be associated with adiposity and may result in leptin resistance and weight gain. Also associated with fatigue.        Currently on vitamin D  supplementation without any adverse effects such as nausea, vomiting or muscle weakness.    Insulin  resistance Continue Category 3  meal plan, limit simple carbohydrates. Increase lean protein, fiber and water. Start exercise with initial goal of 20 minutes 2 days a week Zepbound  should help improve insulin  resistance   Severe obstructive sleep apnea due to  class 3 obesity (HCC)       Continue to work on nutrition, exercise and weight loss       Loss of 10-15% body weight can help improve OSA   Class 3 severe obesity with serious comorbidity and body mass index (BMI) of 40.0 to 44.9 in adult, unspecified obesity type (HCC) See plan above -     Tirzepatide -Weight Management; Inject 5 mg into the skin once a week.  Dispense: 2 mL; Refill: 0         Return in about 4 weeks (around 10/31/2024).Tara Farrell She was informed of the importance of frequent follow up visits to maximize her success with intensive lifestyle modifications for her multiple health conditions.   ATTESTASTION STATEMENTS:  Reviewed by clinician on day of visit: allergies, medications, problem list, medical history, surgical history, family history, social history, and previous encounter notes.     Lonell Liverpool ANP-C "

## 2024-10-31 ENCOUNTER — Telehealth: Payer: Self-pay | Admitting: Hematology and Oncology

## 2024-10-31 NOTE — Telephone Encounter (Signed)
 10/31/2024 left msg to schedule appt.

## 2024-11-01 ENCOUNTER — Encounter (INDEPENDENT_AMBULATORY_CARE_PROVIDER_SITE_OTHER): Payer: Self-pay

## 2024-11-07 ENCOUNTER — Telehealth (INDEPENDENT_AMBULATORY_CARE_PROVIDER_SITE_OTHER): Admitting: Nurse Practitioner

## 2024-11-08 ENCOUNTER — Telehealth (INDEPENDENT_AMBULATORY_CARE_PROVIDER_SITE_OTHER): Payer: Self-pay | Admitting: Nurse Practitioner
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
## 2024-11-10 ENCOUNTER — Telehealth (INDEPENDENT_AMBULATORY_CARE_PROVIDER_SITE_OTHER): Admitting: Nurse Practitioner

## 2024-11-10 ENCOUNTER — Encounter (INDEPENDENT_AMBULATORY_CARE_PROVIDER_SITE_OTHER): Payer: Self-pay | Admitting: Nurse Practitioner

## 2024-11-10 VITALS — Ht 63.5 in | Wt 250.0 lb

## 2024-11-10 DIAGNOSIS — E559 Vitamin D deficiency, unspecified: Secondary | ICD-10-CM | POA: Diagnosis not present

## 2024-11-10 DIAGNOSIS — K219 Gastro-esophageal reflux disease without esophagitis: Secondary | ICD-10-CM

## 2024-11-10 DIAGNOSIS — G4733 Obstructive sleep apnea (adult) (pediatric): Secondary | ICD-10-CM | POA: Diagnosis not present

## 2024-11-10 DIAGNOSIS — Z6841 Body Mass Index (BMI) 40.0 and over, adult: Secondary | ICD-10-CM | POA: Diagnosis not present

## 2024-11-10 DIAGNOSIS — E66813 Obesity, class 3: Secondary | ICD-10-CM | POA: Diagnosis not present

## 2024-11-10 MED ORDER — OMEPRAZOLE 20 MG PO CPDR: 20.0000 mg | DELAYED_RELEASE_CAPSULE | Freq: Every day | ORAL | 0 refills | Status: AC

## 2024-11-10 MED ORDER — VITAMIN D (ERGOCALCIFEROL) 1.25 MG (50000 UNIT) PO CAPS: 50000.0000 [IU] | ORAL_CAPSULE | ORAL | 0 refills | Status: AC

## 2024-11-10 MED ORDER — FAMOTIDINE 40 MG PO TABS: 40.0000 mg | ORAL_TABLET | Freq: Every day | ORAL | 0 refills | Status: AC

## 2024-11-10 NOTE — Progress Notes (Signed)
 "          TeleHealth Visit:  This visit was completed with telemedicine (audio/video) technology. Nature has verbally consented to this TeleHealth visit. The patient is located at home, the provider is located at home. The participants in this visit include the listed provider and patient. The visit was conducted today via MyChart video.  OBESITY Tara Farrell is here to discuss her progress with her obesity treatment plan along with follow-up of her obesity related diagnoses.   Tara Farrell does have severe sleep apnea but does not use CPAP- currently on Zepbound  5 mg QW for assistance with weight loss to hopefully improve sleep apnea symptoms. Today's visit was # 6 Starting weight: 276 lbs Starting date: 07/06/24 Weight at last in office visit: 255 lbs on 10/03/24 Total weight loss: 21 lbs at last in office visit on 10/03/24. Today's reported weight : 250 lbs  Nutrition Plan: the Category 3 plan - 90% adherence.  Current exercise: walking 10-15 minutes 2 days a week  Interim History:  She has noticed she has started to have more cravings and hunger on the 5 mg dose of Zebound and would like to go up to the next dose.  Denies abdominal pain, diarrhea and constipation.  She does have GERD but is controlled with Omeprazole  20 mg QAM and Famotidine  40 mg at bedtime- needs refills on these medication.  Eating all of the food on the plan., Protein intake is as prescribed, Is not drinking sugar sweetened beverages., Is not exceeding snack calorie allotment, Is not skipping meals, Meeting calorie goals., and Water intake is adequate.  Eating all of the prescribed protein: yes - 100- 120 grams/day Skipping meals: No Drinking adequate water: Yes Drinking sugar sweetened beverages: No Hunger controlled: moderately controlled. Cravings controlled:  moderately controlled.  Journaling Consistently:  No Meeting protein goals:  Yes Meeting calorie goals:  Yes   Pharmacotherapy: Tara Farrell is on Zepbound  5.0 mg  SQ weekly Adverse side effects: None Hunger is moderately controlled.  Cravings are moderately controlled.  Assessment/Plan:  Diagnoses and all orders for this visit:  Vitamin D  deficiency Continue to supplement with Ergocalciferol  50000 units once a week Low vitamin D  levels can be associated with adiposity and may result in leptin resistance and weight gain. Also associated with fatigue.  Currently on vitamin D  supplementation without any adverse effects such as nausea, vomiting or muscle weakness.   -     Vitamin D , Ergocalciferol , (DRISDOL ) 1.25 MG (50000 UNIT) CAPS capsule; Take 1 capsule (50,000 Units total) by mouth every 7 (seven) days.  Gastroesophageal reflux disease without esophagitis Continue Prilosec 20 mg QAM and Famotidine  40 mg QPM Continue nutrition and behavior modification Continue to follow regularly with PCP -     famotidine  (PEPCID ) 40 MG tablet; Take 1 tablet (40 mg total) by mouth daily. -     omeprazole  (PRILOSEC) 20 MG capsule; Take 1 capsule (20 mg total) by mouth daily.  Severe obstructive sleep apnea due to class 3 obesity (HCC)       Continue to follow category 3 meal plan.  Increase lean protein, water and fiber.        Increase Zepbound  to 7.5 mg SQ QW- denies side effects from medication  Class 3 severe obesity with serious comorbidity and body mass index (BMI) of 45.0 to 49.9 in adult, unspecified obesity type (HCC) Pharmacotherapy Plan Continue and increase dose to Zepbound  7.5 mg SQ weekly  Tara Farrell is currently in the action stage of change. As  such, her goal is to continue with weight loss efforts.  She has agreed to the Category 3 plan.  Exercise goals: For substantial health benefits, adults should do at least 150 minutes (2 hours and 30 minutes) a week of moderate-intensity, or 75 minutes (1 hour and 15 minutes) a week of vigorous-intensity aerobic physical activity, or an equivalent combination of moderate- and vigorous-intensity aerobic  activity. Aerobic activity should be performed in episodes of at least 10 minutes, and preferably, it should be spread throughout the week.  Behavioral modification strategies: increasing lean protein intake, no meal skipping, increase water intake, better snacking choices, increasing vegetables, avoiding temptations, and mindful eating.  Tara Farrell has agreed to follow-up with our clinic in 4 weeks.   Objective:   VITALS: Per patient if applicable, see vitals. GENERAL: Alert and in no acute distress. CARDIOPULMONARY: No increased WOB. Speaking in clear sentences.  PSYCH: Pleasant and cooperative. Speech normal rate and rhythm. Affect is appropriate. Insight and judgement are appropriate. Attention is focused, linear, and appropriate.  NEURO: Oriented as arrived to appointment on time with no prompting.   Attestation Statements:   Reviewed by clinician on day of visit: allergies, medications, problem list, medical history, surgical history, family history, social history, and previous encounter notes.    This was prepared with the assistance of Engineer, Civil (consulting).  Occasional wrong-word or sound-a-like substitutions may have occurred due to the inherent limitations of voice recognition software.   "

## 2024-11-13 MED ORDER — TIRZEPATIDE-WEIGHT MANAGEMENT 7.5 MG/0.5ML ~~LOC~~ SOLN: 7.5000 mg | SUBCUTANEOUS | 0 refills | Status: AC

## 2024-11-16 ENCOUNTER — Telehealth: Payer: Self-pay

## 2024-11-16 ENCOUNTER — Encounter: Payer: Self-pay | Admitting: Oncology

## 2024-11-16 ENCOUNTER — Other Ambulatory Visit (HOSPITAL_COMMUNITY): Payer: Self-pay

## 2024-11-16 NOTE — Telephone Encounter (Signed)
*  [facility]  Pharmacy Patient Advocate Encounter   Received notification from Fax that prior authorization for Fluticasone  Furoate-Vilanterol 100-25MCG/ACT aerosol powder   is required/requested.   Insurance verification completed.   The patient is insured through [facility].   Per test claim:  Brand Breo Ellipta  is preferred by the insurance.  If suggested medication is appropriate, Please send in a new RX and discontinue this one. If not, please advise as to why it's not appropriate so that we may request a Prior Authorization. Please note, some preferred medications may still require a PA.  If the suggested medications have not been trialed and there are no contraindications to their use, the PA will not be submitted, as it will not be approved. Archived Key: [id number]

## 2024-12-07 ENCOUNTER — Ambulatory Visit (INDEPENDENT_AMBULATORY_CARE_PROVIDER_SITE_OTHER): Admitting: Nurse Practitioner

## 2025-01-04 ENCOUNTER — Ambulatory Visit (INDEPENDENT_AMBULATORY_CARE_PROVIDER_SITE_OTHER): Admitting: Nurse Practitioner
